# Patient Record
Sex: Male | Born: 1935 | Race: White | Hispanic: No
Health system: Southern US, Community
[De-identification: ages and names within clinical notes are randomized; demographics above are authoritative.]

## PROBLEM LIST (undated history)

## (undated) DIAGNOSIS — I739 Peripheral vascular disease, unspecified: Secondary | ICD-10-CM

## (undated) DIAGNOSIS — F329 Major depressive disorder, single episode, unspecified: Secondary | ICD-10-CM

## (undated) DIAGNOSIS — E785 Hyperlipidemia, unspecified: Secondary | ICD-10-CM

## (undated) DIAGNOSIS — G20 Parkinson's disease: Secondary | ICD-10-CM

## (undated) DIAGNOSIS — Z9181 History of falling: Secondary | ICD-10-CM

## (undated) DIAGNOSIS — F0390 Unspecified dementia without behavioral disturbance: Secondary | ICD-10-CM

## (undated) DIAGNOSIS — K5900 Constipation, unspecified: Secondary | ICD-10-CM

## (undated) DIAGNOSIS — I2510 Atherosclerotic heart disease of native coronary artery without angina pectoris: Secondary | ICD-10-CM

## (undated) DIAGNOSIS — G4700 Insomnia, unspecified: Secondary | ICD-10-CM

## (undated) DIAGNOSIS — N189 Chronic kidney disease, unspecified: Secondary | ICD-10-CM

## (undated) DIAGNOSIS — I10 Essential (primary) hypertension: Secondary | ICD-10-CM

## (undated) DIAGNOSIS — K219 Gastro-esophageal reflux disease without esophagitis: Secondary | ICD-10-CM

## (undated) DIAGNOSIS — G20A1 Parkinson's disease without dyskinesia, without mention of fluctuations: Secondary | ICD-10-CM

---

## 2017-11-08 DIAGNOSIS — Z951 Presence of aortocoronary bypass graft: Secondary | ICD-10-CM | POA: Insufficient documentation

## 2019-05-04 DIAGNOSIS — F0280 Dementia in other diseases classified elsewhere without behavioral disturbance: Secondary | ICD-10-CM | POA: Insufficient documentation

## 2021-12-28 ENCOUNTER — Emergency Department (HOSPITAL_COMMUNITY)
Admission: EM | Admit: 2021-12-28 | Discharge: 2021-12-28 | Disposition: A | Discharge status: Home / Self Care | Payer: No Typology Code available for payment source | Attending: Emergency Medicine | Admitting: Emergency Medicine

## 2021-12-28 ENCOUNTER — Other Ambulatory Visit: Payer: Self-pay

## 2021-12-28 ENCOUNTER — Encounter (HOSPITAL_COMMUNITY): Payer: Self-pay | Admitting: Emergency Medicine

## 2021-12-28 ENCOUNTER — Emergency Department (HOSPITAL_COMMUNITY): Payer: No Typology Code available for payment source

## 2021-12-28 DIAGNOSIS — Y92002 Bathroom of unspecified non-institutional (private) residence single-family (private) house as the place of occurrence of the external cause: Secondary | ICD-10-CM | POA: Insufficient documentation

## 2021-12-28 DIAGNOSIS — Z888 Allergy status to other drugs, medicaments and biological substances status: Secondary | ICD-10-CM | POA: Diagnosis not present

## 2021-12-28 DIAGNOSIS — N179 Acute kidney failure, unspecified: Secondary | ICD-10-CM | POA: Diagnosis not present

## 2021-12-28 DIAGNOSIS — I129 Hypertensive chronic kidney disease with stage 1 through stage 4 chronic kidney disease, or unspecified chronic kidney disease: Secondary | ICD-10-CM | POA: Diagnosis not present

## 2021-12-28 DIAGNOSIS — D696 Thrombocytopenia, unspecified: Secondary | ICD-10-CM | POA: Diagnosis not present

## 2021-12-28 DIAGNOSIS — G20 Parkinson's disease: Secondary | ICD-10-CM | POA: Diagnosis not present

## 2021-12-28 DIAGNOSIS — Z781 Physical restraint status: Secondary | ICD-10-CM | POA: Diagnosis not present

## 2021-12-28 DIAGNOSIS — S01111A Laceration without foreign body of right eyelid and periocular area, initial encounter: Secondary | ICD-10-CM | POA: Diagnosis not present

## 2021-12-28 DIAGNOSIS — Z880 Allergy status to penicillin: Secondary | ICD-10-CM | POA: Diagnosis not present

## 2021-12-28 DIAGNOSIS — F0390 Unspecified dementia without behavioral disturbance: Secondary | ICD-10-CM | POA: Diagnosis not present

## 2021-12-28 DIAGNOSIS — E43 Unspecified severe protein-calorie malnutrition: Secondary | ICD-10-CM | POA: Diagnosis not present

## 2021-12-28 DIAGNOSIS — Z91013 Allergy to seafood: Secondary | ICD-10-CM | POA: Diagnosis not present

## 2021-12-28 DIAGNOSIS — W19XXXA Unspecified fall, initial encounter: Secondary | ICD-10-CM

## 2021-12-28 DIAGNOSIS — S0181XA Laceration without foreign body of other part of head, initial encounter: Secondary | ICD-10-CM | POA: Diagnosis not present

## 2021-12-28 DIAGNOSIS — W1812XA Fall from or off toilet with subsequent striking against object, initial encounter: Secondary | ICD-10-CM | POA: Diagnosis not present

## 2021-12-28 DIAGNOSIS — I2510 Atherosclerotic heart disease of native coronary artery without angina pectoris: Secondary | ICD-10-CM | POA: Diagnosis not present

## 2021-12-28 DIAGNOSIS — Z20822 Contact with and (suspected) exposure to covid-19: Secondary | ICD-10-CM | POA: Diagnosis not present

## 2021-12-28 DIAGNOSIS — I455 Other specified heart block: Secondary | ICD-10-CM | POA: Diagnosis not present

## 2021-12-28 DIAGNOSIS — Z23 Encounter for immunization: Secondary | ICD-10-CM | POA: Diagnosis not present

## 2021-12-28 DIAGNOSIS — G9341 Metabolic encephalopathy: Secondary | ICD-10-CM | POA: Diagnosis not present

## 2021-12-28 DIAGNOSIS — E860 Dehydration: Secondary | ICD-10-CM | POA: Diagnosis present

## 2021-12-28 DIAGNOSIS — F32A Depression, unspecified: Secondary | ICD-10-CM | POA: Diagnosis not present

## 2021-12-28 DIAGNOSIS — S0993XA Unspecified injury of face, initial encounter: Secondary | ICD-10-CM | POA: Diagnosis present

## 2021-12-28 DIAGNOSIS — F0280 Dementia in other diseases classified elsewhere without behavioral disturbance: Secondary | ICD-10-CM | POA: Diagnosis not present

## 2021-12-28 DIAGNOSIS — N1831 Chronic kidney disease, stage 3a: Secondary | ICD-10-CM | POA: Diagnosis not present

## 2021-12-28 DIAGNOSIS — Z91041 Radiographic dye allergy status: Secondary | ICD-10-CM | POA: Diagnosis not present

## 2021-12-28 HISTORY — DX: Parkinson's disease: G20

## 2021-12-28 HISTORY — DX: Parkinson's disease without dyskinesia, without mention of fluctuations: G20.A1

## 2021-12-28 LAB — CBC WITH DIFFERENTIAL/PLATELET
Abs Immature Granulocytes: 0.02 10*3/uL (ref 0.00–0.07)
Basophils Absolute: 0.1 10*3/uL (ref 0.0–0.1)
Basophils Relative: 1 %
Eosinophils Absolute: 0.4 10*3/uL (ref 0.0–0.5)
Eosinophils Relative: 6 %
HCT: 41.9 % (ref 39.0–52.0)
Hemoglobin: 14.4 g/dL (ref 13.0–17.0)
Immature Granulocytes: 0 %
Lymphocytes Relative: 30 %
Lymphs Abs: 1.9 10*3/uL (ref 0.7–4.0)
MCH: 31.8 pg (ref 26.0–34.0)
MCHC: 34.4 g/dL (ref 30.0–36.0)
MCV: 92.5 fL (ref 80.0–100.0)
Monocytes Absolute: 0.7 10*3/uL (ref 0.1–1.0)
Monocytes Relative: 12 %
Neutro Abs: 3.2 10*3/uL (ref 1.7–7.7)
Neutrophils Relative %: 51 %
Platelets: 142 10*3/uL — ABNORMAL LOW (ref 150–400)
RBC: 4.53 MIL/uL (ref 4.22–5.81)
RDW: 13.2 % (ref 11.5–15.5)
WBC: 6.2 10*3/uL (ref 4.0–10.5)
nRBC: 0 % (ref 0.0–0.2)

## 2021-12-28 LAB — COMPREHENSIVE METABOLIC PANEL
ALT: 13 U/L (ref 0–44)
AST: 16 U/L (ref 15–41)
Albumin: 3.9 g/dL (ref 3.5–5.0)
Alkaline Phosphatase: 67 U/L (ref 38–126)
Anion gap: 7 (ref 5–15)
BUN: 23 mg/dL (ref 8–23)
CO2: 24 mmol/L (ref 22–32)
Calcium: 8.8 mg/dL — ABNORMAL LOW (ref 8.9–10.3)
Chloride: 115 mmol/L — ABNORMAL HIGH (ref 98–111)
Creatinine, Ser: 1.4 mg/dL — ABNORMAL HIGH (ref 0.61–1.24)
GFR, Estimated: 50 mL/min — ABNORMAL LOW (ref >60)
Glucose, Bld: 116 mg/dL — ABNORMAL HIGH (ref 70–99)
Potassium: 3.7 mmol/L (ref 3.5–5.1)
Sodium: 146 mmol/L — ABNORMAL HIGH (ref 135–145)
Total Bilirubin: 0.6 mg/dL (ref 0.3–1.2)
Total Protein: 6.6 g/dL (ref 6.5–8.1)

## 2021-12-28 LAB — URINALYSIS, ROUTINE W REFLEX MICROSCOPIC
Bacteria, UA: NONE SEEN
Bilirubin Urine: NEGATIVE
Glucose, UA: NEGATIVE mg/dL
Ketones, ur: NEGATIVE mg/dL
Leukocytes,Ua: NEGATIVE
Nitrite: NEGATIVE
Protein, ur: NEGATIVE mg/dL
Specific Gravity, Urine: 1.006 (ref 1.005–1.030)
pH: 7 (ref 5.0–8.0)

## 2021-12-28 MED ORDER — LIDOCAINE-EPINEPHRINE (PF) 2 %-1:200000 IJ SOLN
10.0000 mL | Freq: Once | INTRAMUSCULAR | Status: DC
  Filled 2021-12-28: qty 20

## 2021-12-28 MED ORDER — TETANUS-DIPHTH-ACELL PERTUSSIS 5-2.5-18.5 LF-MCG/0.5 IM SUSY
0.5000 mL | PREFILLED_SYRINGE | Freq: Once | INTRAMUSCULAR | Status: AC
  Administered 2021-12-28: 0.5 mL via INTRAMUSCULAR
  Filled 2021-12-28: qty 0.5

## 2021-12-28 NOTE — Discharge Instructions (Addendum)
It was a pleasure taking care of you today!  ? ?Your CT head was unremarkable.  Your laceration was repaired with Dermabond in the ED.  Keep the area clean and dry.  Return to the emergency department if worsening or persistent pain, drainage of wound, increased swelling, or color change to area.  ? ?Please use Tylenol or ibuprofen for pain.  You may use 600 mg ibuprofen every 6 hours or 1000 mg of Tylenol every 6 hours.  You may choose to alternate between the 2.  This would be most effective.  Not to exceed 4 g of Tylenol within 24 hours.  Not to exceed 3200 mg ibuprofen 24 hours. ? ?You can use ice for up to 15 minutes at a time to the affected area.  I encourage you to drink plenty of fluids in the next few days to help with possible mild dehydration. ?

## 2021-12-28 NOTE — ED Provider Notes (Signed)
?[facility] DEPT ?Provider Note ? ? ?CSN: [id number] ?Arrival date & time: 12/28/21  1815 ? ?  ? ?History ? ?Chief Complaint  ?Patient presents with  ? Fall  ? Head Laceration  ? ? ?Jose Gilbert is a 88 y.o. male with a past medical history of dementia from [place] who presents to the emergency department with concerns for unwitnessed fall prior to arrival.  Patient's daughter noted that she presented to patient's room at [facility] after the fall and helped the patient up.  She notes the patient was wedged between the toilet and the wall.  Patient daughter notes that he is at baseline at this time.  Patient denies chest pain, shortness of breath, neck pain, back pain, nausea, vomiting.  Patient is not on blood thinners at this time. ? ? ?The history is provided by the patient. No language interpreter was used.  ? ?  ? ?Home Medications ?Prior to Admission medications   ?Not on File  ?   ? ?Allergies    ?Benadryl [diphenhydramine], Imdur [isosorbide nitrate], Iodinated contrast media, Mirtazapine, Penicillins, Shellfish allergy, and Tramadol   ? ?Review of Systems   ?Review of Systems  ?Constitutional:  Negative for chills and fever.  ?Respiratory:  Negative for shortness of breath.   ?Cardiovascular:  Negative for chest pain.  ?Gastrointestinal:  Negative for nausea and vomiting.  ?Musculoskeletal:  Negative for neck pain.  ?Skin:  Positive for wound.  ?All other systems reviewed and are negative. ? ?Physical Exam ?Updated Vital Signs ?BP (!) 188/78   Pulse 72   Temp 98.1 ?F (36.7 ?C) (Oral)   Resp 19   SpO2 99%  ?Physical Exam ?Vitals and nursing note reviewed.  ?Constitutional:   ?   General: He is not in acute distress. ?   Appearance: He is not diaphoretic.  ?HENT:  ?   Head: Normocephalic. Laceration present.  ?   Comments: 2 cm laceration noted to right upper eyelid.  1 cm laceration noted inferior to right eye.  No tenderness to palpation noted to the area.  Bleeding  controlled. ?   Mouth/Throat:  ?   Pharynx: No oropharyngeal exudate.  ?Eyes:  ?   General: No scleral icterus. ?   Conjunctiva/sclera: Conjunctivae normal.  ?Cardiovascular:  ?   Rate and Rhythm: Normal rate and regular rhythm.  ?   Pulses: Normal pulses.  ?   Heart sounds: Normal heart sounds.  ?Pulmonary:  ?   Effort: Pulmonary effort is normal. No respiratory distress.  ?   Breath sounds: Normal breath sounds. No wheezing.  ?Abdominal:  ?   General: Bowel sounds are normal.  ?   Palpations: Abdomen is soft. There is no mass.  ?   Tenderness: There is no abdominal tenderness. There is no guarding or rebound.  ?Musculoskeletal:     ?   General: Normal range of motion.  ?   Cervical back: Normal range of motion and neck supple.  ?Skin: ?   General: Skin is warm and dry.  ?Neurological:  ?   Mental Status: He is alert.  ?Psychiatric:     ?   Behavior: Behavior normal.  ? ? ?ED Results / Procedures / Treatments   ?Labs ?(all labs ordered are listed, but only abnormal results are displayed) ?Labs Reviewed - No data to display ? ?EKG ?None ? ?Radiology ?CT Head Wo Contrast ? ?Result Date: 12/28/2021 ?CLINICAL DATA:  Trauma, fall EXAM: CT HEAD WITHOUT CONTRAST TECHNIQUE: Contiguous axial images  were obtained from the base of the skull through the vertex without intravenous contrast. RADIATION DOSE REDUCTION: This exam was performed according to the departmental dose-optimization program which includes automated exposure control, adjustment of the mA and/or kV according to patient size and/or use of iterative reconstruction technique. COMPARISON:  None Available. FINDINGS: Brain: No acute intracranial findings are seen in noncontrast CT brain. There are no signs of bleeding. Cortical sulci are prominent. There is decreased density in the periventricular white matter. Small calcifications are seen in the basal ganglia. Vascular: Scattered arterial calcifications are seen. Skull: No fracture is seen in the calvarium.  Sinuses/Orbits: Paranasal sinuses are unremarkable. Postsurgical changes are noted in both optic globes. Other: None IMPRESSION: No acute intracranial findings are seen in noncontrast CT brain. Atrophy. Small-vessel disease. Electronically Signed   By: Ernie Avena M.D.   On: 12/28/2021 18:53   ? ?Procedures ?Marland Kitchen.Laceration Repair ? ?Date/Time: 12/28/2021 9:55 PM ?Performed by: Cyndee Brightly, Jasyah Theurer A, PA-C ?Authorized by: Chestine Spore A, PA-C  ? ?Consent:  ?  Consent obtained:  Verbal ?  Consent given by:  Patient ?  Risks discussed:  Infection, pain and need for additional repair ?Universal protocol:  ?  Test results available: yes   ?  Patient identity confirmed:  Verbally with patient and hospital-assigned identification number ?Anesthesia:  ?  Anesthesia method:  None ?Laceration details:  ?  Location:  Face ?  Face location:  R upper eyelid ?  Extent:  Superficial ?Pre-procedure details:  ?  Preparation:  Patient was prepped and draped in usual sterile fashion ?Exploration:  ?  Hemostasis achieved with:  Direct pressure ?  Imaging outcome: foreign body not noted   ?  Wound exploration: entire depth of wound visualized   ?Treatment:  ?  Area cleansed with:  Saline ?  Amount of cleaning:  Standard ?  Irrigation solution:  Sterile saline ?  Irrigation method:  Syringe ?  Visualized foreign bodies/material removed: no   ?Skin repair:  ?  Repair method:  Tissue adhesive ?Approximation:  ?  Approximation:  Close ?Repair type:  ?  Repair type:  Simple ?Post-procedure details:  ?  Dressing:  Non-adherent dressing ?  Procedure completion:  Tolerated well, no immediate complications  ? ? ?Medications Ordered in ED ?Medications - No data to display ? ?ED Course/ Medical Decision Making/ A&P ?Clinical Course as of 12/28/21 2203  ?Mon Dec 28, 2021  ?2202 If labs okay, ambulate to freedom. Unwitnessed fall, dementia, no blood thinners. Lac already repaired. [CP]  ?  ?Clinical Course User Index ?[CP] Prosperi, Christian H, PA-C   ? ? ?                        ?Medical Decision Making ?Amount and/or Complexity of Data Reviewed ?Labs: ordered. ?Radiology: ordered. ? ?Risk ?Prescription drug management. ? ? ?Patient presents with fall and laceration noted onset prior to arrival.  This was unwitnessed fall.  Patient is from [place].  Patient accompanied by daughter at bedside.  Patient is not on anticoagulants at this time.  Vital signs stable, patient afebrile.  Tetanus updated in the ED.  Laceration occurred < 12 hours prior to repair.  Differential diagnosis includes, fracture, foreign body, dislocation, avulsion, anemia, arrhythmia, intracranial abnormality.  ? ?Additional history obtained:  ?Additional history obtained from Daughter/Son ? ?Labs:  ?I ordered, and personally interpreted labs.  The pertinent results include:   ?CBC, BMP, urinalysis ordered with results pending at time  of sign-out. ? ?Imaging: ?I ordered imaging studies including CT head  ?I independently visualized and interpreted imaging which showed:  ?No acute intracranial findings are seen in noncontrast CT brain.  ?Atrophy. Small-vessel disease.  ? ?I agree with the radiologist interpretation ? ? ?Patient case discussed with Quentin Mulling, PA-C at sign-out. Plan at sign-out is pending labs and ambulation. Likely discharge home if no abnormalities. Patient care transferred at sign out.  ? ? ? ?This chart was dictated using voice recognition software, Dragon. Despite the best efforts of this provider to proofread and correct errors, errors may still occur which can change documentation meaning. ? ? ?Final Clinical Impression(s) / ED Diagnoses ?Final diagnoses:  ?Fall, initial encounter  ?Facial laceration, initial encounter  ? ? ?Rx / DC Orders ?ED Discharge Orders   ? ? None  ? ?  ? ? ?  ?Evelene Roussin A, PA-C ?12/28/21 2207 ? ?  ?Drenda Freeze, MD ?12/28/21 2337 ? ?

## 2021-12-28 NOTE — ED Provider Notes (Signed)
Accepted handoff at shift change from [facility]. Please see prior provider note for more detail.  ? ?Briefly: Patient is 88 y.o.  ? ?DDX: concern for unwitnessed fall at assisted living facility in [place].  Daughter found him shortly thereafter.  He had a laceration of the right eyebrow which was repaired prior to meeting over the patient.  He has history of Parkinson's, dementia.  Patient does not complain of any pain. ? ?Plan: I personally interpreted lab work including CBC with differential which is unremarkable, CMP with mild elevation of sodium, creatinine 1.4, very mild hypernatremia, mildly elevated creatinine with no baseline on file may suggest minimal dehydration, will encourage oral hydration at his facility.  His urine is unremarkable.  CT head without contrast unremarkable prior to my evaluation. ? ?Patient is ambulatory for discharge.  He is discharged stable condition at this time, encouraged Tylenol, ice for pain control, encourage oral hydration at facility. ? ? ? ? ? ?RISR ? ?EDTHIS ? ?  ?Olene Floss, PA-C ?12/28/21 2302 ? ?  ?Charlynne Pander, MD ?12/28/21 2339 ? ?

## 2021-12-28 NOTE — ED Triage Notes (Signed)
Pt had an unwitnessed fall at [facility]. Pt was found between the toilet and the wall. Pt has lacerations to his right eyebrow. Pt has hx of Parkinson's. Pt has hx of intermittent unresponsiveness d/t his Parkinson's at baseline. ?

## 2021-12-30 ENCOUNTER — Other Ambulatory Visit: Payer: Self-pay

## 2021-12-30 ENCOUNTER — Inpatient Hospital Stay (HOSPITAL_COMMUNITY)
Admission: EM | Admit: 2021-12-30 | Discharge: 2022-01-04 | DRG: 682 | Disposition: A | Discharge status: Skilled Nursing Facility | Payer: No Typology Code available for payment source | Source: Skilled Nursing Facility | Attending: Internal Medicine | Admitting: Internal Medicine

## 2021-12-30 ENCOUNTER — Emergency Department (HOSPITAL_COMMUNITY): Payer: No Typology Code available for payment source

## 2021-12-30 DIAGNOSIS — E43 Unspecified severe protein-calorie malnutrition: Secondary | ICD-10-CM | POA: Insufficient documentation

## 2021-12-30 DIAGNOSIS — W19XXXA Unspecified fall, initial encounter: Secondary | ICD-10-CM | POA: Diagnosis present

## 2021-12-30 DIAGNOSIS — I129 Hypertensive chronic kidney disease with stage 1 through stage 4 chronic kidney disease, or unspecified chronic kidney disease: Secondary | ICD-10-CM | POA: Diagnosis present

## 2021-12-30 DIAGNOSIS — R5383 Other fatigue: Secondary | ICD-10-CM

## 2021-12-30 DIAGNOSIS — Z781 Physical restraint status: Secondary | ICD-10-CM

## 2021-12-30 DIAGNOSIS — E860 Dehydration: Secondary | ICD-10-CM | POA: Diagnosis not present

## 2021-12-30 DIAGNOSIS — Z91041 Radiographic dye allergy status: Secondary | ICD-10-CM

## 2021-12-30 DIAGNOSIS — F32A Depression, unspecified: Secondary | ICD-10-CM | POA: Diagnosis present

## 2021-12-30 DIAGNOSIS — S01111A Laceration without foreign body of right eyelid and periocular area, initial encounter: Secondary | ICD-10-CM | POA: Diagnosis present

## 2021-12-30 DIAGNOSIS — G20 Parkinson's disease: Secondary | ICD-10-CM | POA: Diagnosis present

## 2021-12-30 DIAGNOSIS — I455 Other specified heart block: Secondary | ICD-10-CM | POA: Diagnosis present

## 2021-12-30 DIAGNOSIS — D696 Thrombocytopenia, unspecified: Secondary | ICD-10-CM | POA: Diagnosis present

## 2021-12-30 DIAGNOSIS — N179 Acute kidney failure, unspecified: Principal | ICD-10-CM | POA: Diagnosis present

## 2021-12-30 DIAGNOSIS — G9341 Metabolic encephalopathy: Secondary | ICD-10-CM | POA: Diagnosis present

## 2021-12-30 DIAGNOSIS — G9340 Encephalopathy, unspecified: Secondary | ICD-10-CM | POA: Diagnosis present

## 2021-12-30 DIAGNOSIS — Z23 Encounter for immunization: Secondary | ICD-10-CM

## 2021-12-30 DIAGNOSIS — I2510 Atherosclerotic heart disease of native coronary artery without angina pectoris: Secondary | ICD-10-CM | POA: Diagnosis present

## 2021-12-30 DIAGNOSIS — Z91013 Allergy to seafood: Secondary | ICD-10-CM

## 2021-12-30 DIAGNOSIS — S0990XA Unspecified injury of head, initial encounter: Secondary | ICD-10-CM

## 2021-12-30 DIAGNOSIS — N1831 Chronic kidney disease, stage 3a: Secondary | ICD-10-CM | POA: Diagnosis present

## 2021-12-30 DIAGNOSIS — F0280 Dementia in other diseases classified elsewhere without behavioral disturbance: Secondary | ICD-10-CM | POA: Diagnosis present

## 2021-12-30 DIAGNOSIS — Z20822 Contact with and (suspected) exposure to covid-19: Secondary | ICD-10-CM | POA: Diagnosis present

## 2021-12-30 DIAGNOSIS — Z880 Allergy status to penicillin: Secondary | ICD-10-CM

## 2021-12-30 DIAGNOSIS — Z888 Allergy status to other drugs, medicaments and biological substances status: Secondary | ICD-10-CM

## 2021-12-30 HISTORY — DX: Essential (primary) hypertension: I10

## 2021-12-30 LAB — URINALYSIS, ROUTINE W REFLEX MICROSCOPIC
Bilirubin Urine: NEGATIVE
Glucose, UA: NEGATIVE mg/dL
Hgb urine dipstick: NEGATIVE
Ketones, ur: NEGATIVE mg/dL
Leukocytes,Ua: NEGATIVE
Nitrite: NEGATIVE
Protein, ur: NEGATIVE mg/dL
Specific Gravity, Urine: 1.009 (ref 1.005–1.030)
pH: 6 (ref 5.0–8.0)

## 2021-12-30 LAB — COMPREHENSIVE METABOLIC PANEL
ALT: 14 U/L (ref 0–44)
AST: 15 U/L (ref 15–41)
Albumin: 4.3 g/dL (ref 3.5–5.0)
Alkaline Phosphatase: 80 U/L (ref 38–126)
Anion gap: 5 (ref 5–15)
BUN: 28 mg/dL — ABNORMAL HIGH (ref 8–23)
CO2: 26 mmol/L (ref 22–32)
Calcium: 9.1 mg/dL (ref 8.9–10.3)
Chloride: 112 mmol/L — ABNORMAL HIGH (ref 98–111)
Creatinine, Ser: 1.95 mg/dL — ABNORMAL HIGH (ref 0.61–1.24)
GFR, Estimated: 34 mL/min — ABNORMAL LOW (ref >60)
Glucose, Bld: 111 mg/dL — ABNORMAL HIGH (ref 70–99)
Potassium: 4.6 mmol/L (ref 3.5–5.1)
Sodium: 143 mmol/L (ref 135–145)
Total Bilirubin: 0.5 mg/dL (ref 0.3–1.2)
Total Protein: 7.2 g/dL (ref 6.5–8.1)

## 2021-12-30 LAB — CBC WITH DIFFERENTIAL/PLATELET
Abs Immature Granulocytes: 0.01 10*3/uL (ref 0.00–0.07)
Basophils Absolute: 0 10*3/uL (ref 0.0–0.1)
Basophils Relative: 1 %
Eosinophils Absolute: 0.4 10*3/uL (ref 0.0–0.5)
Eosinophils Relative: 7 %
HCT: 42.6 % (ref 39.0–52.0)
Hemoglobin: 14.3 g/dL (ref 13.0–17.0)
Immature Granulocytes: 0 %
Lymphocytes Relative: 45 %
Lymphs Abs: 3 10*3/uL (ref 0.7–4.0)
MCH: 30.8 pg (ref 26.0–34.0)
MCHC: 33.6 g/dL (ref 30.0–36.0)
MCV: 91.8 fL (ref 80.0–100.0)
Monocytes Absolute: 0.8 10*3/uL (ref 0.1–1.0)
Monocytes Relative: 12 %
Neutro Abs: 2.3 10*3/uL (ref 1.7–7.7)
Neutrophils Relative %: 35 %
Platelets: 142 10*3/uL — ABNORMAL LOW (ref 150–400)
RBC: 4.64 MIL/uL (ref 4.22–5.81)
RDW: 13.3 % (ref 11.5–15.5)
WBC: 6.6 10*3/uL (ref 4.0–10.5)
nRBC: 0 % (ref 0.0–0.2)

## 2021-12-30 LAB — RESP PANEL BY RT-PCR (FLU A&B, COVID) ARPGX2
Influenza A by PCR: NEGATIVE
Influenza B by PCR: NEGATIVE
SARS Coronavirus 2 by RT PCR: NEGATIVE

## 2021-12-30 LAB — TROPONIN I (HIGH SENSITIVITY): Troponin I (High Sensitivity): 6 ng/L (ref <18)

## 2021-12-30 LAB — TSH: TSH: 0.982 u[IU]/mL (ref 0.350–4.500)

## 2021-12-30 MED ORDER — LACTATED RINGERS IV BOLUS
1000.0000 mL | Freq: Once | INTRAVENOUS | Status: AC
  Administered 2021-12-31: 1000 mL via INTRAVENOUS

## 2021-12-30 NOTE — ED Provider Notes (Signed)
[facility]-EMERGENCY DEPT Provider Note   CSN: [id number] Arrival date & time: 12/30/21  2000     History  No chief complaint on file.   Jose Gilbert is a 88 y.o. male.   Patient as above with significant medical history as below, including Parkinson's disease, lives at nursing facility who presents to the ED with complaint of fatigue.  AMS.  Patient recently seen in the ER for a fall.  CT at the time was negative.  Facility reports patient was more somnolent this afternoon than normal.  Family saw him this afternoon at lunchtime and said he was more sleepy than normal, patient request to go to bed early.   Daughter felt this was abnormal and patient was sent to the ER for evaluation.  Patient has been refusing meals, not drinking much liquid past 24 to 48 hours.  On my assessment patient has no acute complaints.  He has no headaches, chest pain, no nausea or vomiting.  No vision changes, no numbness or tingling.  Reports he is eating and drinking normally.  He has no discomfort.  When asked patient why he was not drinking liquids he responded by saying that "nobody gives me anything to drink."    Past Medical History: No date: Parkinson's disease (HCC)  No past surgical history on file.    The history is provided by the patient and a relative. No language interpreter was used.      Home Medications Prior to Admission medications   Not on File      Allergies    Benadryl [diphenhydramine], Imdur [isosorbide nitrate], Iodinated contrast media, Mirtazapine, Penicillins, Shellfish allergy, and Tramadol    Review of Systems   Review of Systems  Constitutional:  Positive for fatigue. Negative for chills and fever.  HENT:  Negative for facial swelling and trouble swallowing.   Eyes:  Negative for photophobia and visual disturbance.  Respiratory:  Negative for cough and shortness of breath.   Cardiovascular:  Negative for chest pain and palpitations.   Gastrointestinal:  Negative for abdominal pain, nausea and vomiting.  Endocrine: Negative for polydipsia and polyuria.  Genitourinary:  Negative for difficulty urinating and hematuria.  Musculoskeletal:  Negative for gait problem and joint swelling.  Skin:  Negative for pallor and rash.  Neurological:  Negative for syncope and headaches.  Psychiatric/Behavioral:  Negative for agitation and confusion.    Physical Exam Updated Vital Signs BP (!) 144/111   Pulse 70   Temp 98.8 F (37.1 C) (Rectal)   Resp 20   SpO2 100%  Physical Exam Vitals and nursing note reviewed.  Constitutional:      General: He is not in acute distress.    Appearance: Normal appearance. He is well-developed.  HENT:     Head: Normocephalic.     Comments: Tissue adhesive noted to right temporal, periorbital area.    Right Ear: External ear normal.     Left Ear: External ear normal.     Mouth/Throat:     Mouth: Mucous membranes are dry.  Eyes:     General: No scleral icterus.    Extraocular Movements: Extraocular movements intact.     Pupils: Pupils are equal, round, and reactive to light.  Cardiovascular:     Rate and Rhythm: Normal rate and regular rhythm.     Pulses: Normal pulses.     Heart sounds: Normal heart sounds.  Pulmonary:     Effort: Pulmonary effort is normal. No respiratory distress.  Breath sounds: Normal breath sounds.  Abdominal:     General: Abdomen is flat.     Palpations: Abdomen is soft.     Tenderness: There is no abdominal tenderness.  Musculoskeletal:        General: Normal range of motion.     Cervical back: Normal range of motion.     Right lower leg: No edema.     Left lower leg: No edema.  Skin:    General: Skin is warm and dry.     Capillary Refill: Capillary refill takes less than 2 seconds.  Neurological:     Mental Status: He is alert and oriented to person, place, and time.     GCS: GCS eye subscore is 4. GCS verbal subscore is 5. GCS motor subscore is 6.      Cranial Nerves: Cranial nerves 2-12 are intact.     Sensory: Sensation is intact.     Motor: Motor function is intact.     Coordination: Coordination is intact.  Psychiatric:        Mood and Affect: Mood normal.        Behavior: Behavior normal.    ED Results / Procedures / Treatments   Labs (all labs ordered are listed, but only abnormal results are displayed) Labs Reviewed  CBC WITH DIFFERENTIAL/PLATELET - Abnormal; Notable for the following components:      Result Value   Platelets 142 (*)    All other components within normal limits  COMPREHENSIVE METABOLIC PANEL - Abnormal; Notable for the following components:   Chloride 112 (*)    Glucose, Bld 111 (*)    BUN 28 (*)    Creatinine, Ser 1.95 (*)    GFR, Estimated 34 (*)    All other components within normal limits  URINALYSIS, ROUTINE W REFLEX MICROSCOPIC - Abnormal; Notable for the following components:   Color, Urine STRAW (*)    All other components within normal limits  RESP PANEL BY RT-PCR (FLU A&B, COVID) ARPGX2  TSH  TROPONIN I (HIGH SENSITIVITY)  TROPONIN I (HIGH SENSITIVITY)    EKG EKG Interpretation  Date/Time:  Wednesday Dec 30 2021 20:23:03 EDT Ventricular Rate:  71 PR Interval:  331 QRS Duration: 108 QT Interval:  413 QTC Calculation: 449 R Axis:   -44 Text Interpretation: Sinus rhythm Prolonged PR interval Abnormal R-wave progression, early transition Inferior infarct, old Similar to prior tracing Confirmed by Tanda Rockers ([id number]) on 12/31/2021 12:02:12 AM  Radiology CT Head Wo Contrast  Result Date: 12/30/2021 CLINICAL DATA:  Fall injury.  Delirium.  Altered mental status. EXAM: CT HEAD WITHOUT CONTRAST TECHNIQUE: Contiguous axial images were obtained from the base of the skull through the vertex without intravenous contrast. RADIATION DOSE REDUCTION: This exam was performed according to the departmental dose-optimization program which includes automated exposure control, adjustment of the mA and/or kV  according to patient size and/or use of iterative reconstruction technique. COMPARISON:  Head CT 12/28/2021. FINDINGS: Brain: There is moderate cerebral atrophy, small vessel disease and atrophic ventriculomegaly, without midline shift and with senescent calcification in the basal ganglia. Cerebellum and brainstem are unremarkable, as visualized. No asymmetry is seen worrisome for acute infarct, hemorrhage or mass. 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 There is no mass effect. Vascular: There calcifications of the carotid siphons and distal vertebral arteries but no hyperdense central vasculature. Skull: Intact calvarium, skull base and orbits. No focal bone lesion. No visible scalp hematoma. Sinuses/Orbits: No acute abnormality.  No appreciable mastoid effusion. Old lens replacements. Other: None. IMPRESSION: Chronic changes. No acute intracranial CT findings, depressed skull fractures or interval changes. Electronically Signed   By: Almira Bar M.D.   On: 12/30/2021 21:15   DG Chest Portable 1 View  Result Date: 12/30/2021 CLINICAL DATA:  Altered mental status EXAM: PORTABLE CHEST 1 VIEW COMPARISON:  None Available. FINDINGS: Lungs are clear.  No pleural effusion or pneumothorax. The heart is normal in size. Median sternotomy. IMPRESSION: No evidence of acute cardiopulmonary disease. Electronically Signed   By: Charline Bills M.D.   On: 12/30/2021 21:18    Procedures Procedures    Medications Ordered in ED Medications  lactated ringers bolus 1,000 mL (1,000 mLs Intravenous New Bag/Given 12/31/21 0049)    ED Course/ Medical Decision Making/ A&P Clinical Course as of 12/31/21 0056  Wed Dec 30, 2021  2345 Sandre Kitty Daughter     [phone]  [SG]    Clinical Course User Index [SG] Sloan Leiter, DO                           Medical Decision Making Amount and/or Complexity of Data Reviewed Labs: ordered. Radiology:  ordered.  Risk Decision regarding hospitalization.    CC: Fatigue, sleepy  This patient presents to the Emergency Department for the above complaint. This involves an extensive number of treatment options and is a complaint that carries with it a high risk of complications and morbidity. Vital signs were reviewed. Serious etiologies considered.  DDx includes not limited to concussion, closed head injury, metabolic, infectious, dehydration, progression of Parkinson disease, other acute abnormalities  Record review:  Previous records obtained and reviewed  Recent ED notes, recent labs and imaging.  Additional history obtained from daughter [facility] and surgical history as noted above.   Work up as above, notable for:  Labs & imaging results that were available during my care of the patient were visualized by me and considered in my medical decision making.   I ordered imaging studies which included chest x-ray, CT head and I visualized the imaging and I agree with radiologist interpretation.  No acute process  Cardiac monitoring reviewed and interpreted personally which shows NSR  Labs reviewed, creatinine 3 days ago is 1.4, creatinine today is 1.95.  GFR is worsened.  Electrolytes stable.  BUN is elevated.  Patient does report reduced p.o. intake past 2 days since his fall.  Patient to be started on IV fluids.  Given worsening renal function of the past 48 hours, reduced p.o. intake.  Would recommend admission for overnight rehydration.  Repeat creatinine tomorrow.  Patient is agreeable.  Family agreeable.  Management: IV fluids  Mental status change likely secondary to dehydration in conjunction with possible concussion. Neuro exam is non-focal, low suspicion for CVA.   Recommend admission, patient and family agreeable.  Discussed with Dr. Antionette Char who accepts pt. Pt is HDS.                  Social determinants of health include -  Social History    Socioeconomic History   Marital status: Widowed    Spouse name: Not on file   Number of children: Not on file   Years of education: Not on file   Highest education level: Not on file  Occupational History   Not on file  Tobacco Use   Smoking status: Unknown   Smokeless tobacco: Not on file  Substance and  Sexual Activity   Alcohol use: Not Currently   Drug use: Not Currently   Sexual activity: Not Currently  Other Topics Concern   Not on file  Social History Narrative   Not on file   Social Determinants of Health   Financial Resource Strain: Not on file  Food Insecurity: Not on file  Transportation Needs: Not on file  Physical Activity: Not on file  Stress: Not on file  Social Connections: Not on file  Intimate Partner Violence: Not on file      This chart was dictated using voice recognition software.  Despite best efforts to proofread,  errors can occur which can change the documentation meaning.         Final Clinical Impression(s) / ED Diagnoses Final diagnoses:  Injury of head, initial encounter  Other fatigue  AKI (acute kidney injury) (HCC)  Dehydration    Rx / DC Orders ED Discharge Orders     None         Sloan Leiter, DO 12/31/21 5409

## 2021-12-30 NOTE — ED Notes (Signed)
Call received from pt daughter Rickey Barbara [phone] requesting rtn call for pt status/updates. ENMiles ?

## 2021-12-30 NOTE — ED Triage Notes (Signed)
Patient arrived via EMS - per EMS patient was sent from facility due to [place]. EMS states facility could not give how the patients cognitive status is at baseline and just stated that "he is not usually like this". Patient had a fall two days ago and has abrasion/contusion around the right orbital area (was seen at [facility] for this fall). Patient is alert, self and place but unable to state situation or time and does not recall the president or current year. Patient states he was having pain in his lower legs and was moving legs back and forth  ?

## 2021-12-30 NOTE — ED Notes (Signed)
X1 Set of blood cultures obtained if needed. ?

## 2021-12-30 NOTE — ED Notes (Signed)
Received handoff report from Joy D RN prior to transfer of care ?

## 2021-12-30 NOTE — ED Notes (Signed)
Pt transported to XR.  

## 2021-12-31 ENCOUNTER — Encounter (HOSPITAL_COMMUNITY): Payer: Self-pay | Admitting: Family Medicine

## 2021-12-31 DIAGNOSIS — I455 Other specified heart block: Secondary | ICD-10-CM | POA: Diagnosis present

## 2021-12-31 DIAGNOSIS — F32A Depression, unspecified: Secondary | ICD-10-CM | POA: Diagnosis present

## 2021-12-31 DIAGNOSIS — Z20822 Contact with and (suspected) exposure to covid-19: Secondary | ICD-10-CM | POA: Diagnosis present

## 2021-12-31 DIAGNOSIS — I10 Essential (primary) hypertension: Secondary | ICD-10-CM | POA: Insufficient documentation

## 2021-12-31 DIAGNOSIS — G9340 Encephalopathy, unspecified: Secondary | ICD-10-CM | POA: Diagnosis present

## 2021-12-31 DIAGNOSIS — Z888 Allergy status to other drugs, medicaments and biological substances status: Secondary | ICD-10-CM | POA: Diagnosis not present

## 2021-12-31 DIAGNOSIS — G20 Parkinson's disease: Secondary | ICD-10-CM

## 2021-12-31 DIAGNOSIS — N1831 Chronic kidney disease, stage 3a: Secondary | ICD-10-CM | POA: Diagnosis present

## 2021-12-31 DIAGNOSIS — S01111A Laceration without foreign body of right eyelid and periocular area, initial encounter: Secondary | ICD-10-CM | POA: Diagnosis present

## 2021-12-31 DIAGNOSIS — Z91013 Allergy to seafood: Secondary | ICD-10-CM | POA: Diagnosis not present

## 2021-12-31 DIAGNOSIS — I2510 Atherosclerotic heart disease of native coronary artery without angina pectoris: Secondary | ICD-10-CM

## 2021-12-31 DIAGNOSIS — E43 Unspecified severe protein-calorie malnutrition: Secondary | ICD-10-CM | POA: Diagnosis present

## 2021-12-31 DIAGNOSIS — Z781 Physical restraint status: Secondary | ICD-10-CM | POA: Diagnosis not present

## 2021-12-31 DIAGNOSIS — I129 Hypertensive chronic kidney disease with stage 1 through stage 4 chronic kidney disease, or unspecified chronic kidney disease: Secondary | ICD-10-CM | POA: Diagnosis present

## 2021-12-31 DIAGNOSIS — Z91041 Radiographic dye allergy status: Secondary | ICD-10-CM | POA: Diagnosis not present

## 2021-12-31 DIAGNOSIS — D696 Thrombocytopenia, unspecified: Secondary | ICD-10-CM | POA: Diagnosis present

## 2021-12-31 DIAGNOSIS — N179 Acute kidney failure, unspecified: Principal | ICD-10-CM

## 2021-12-31 DIAGNOSIS — E860 Dehydration: Secondary | ICD-10-CM | POA: Diagnosis present

## 2021-12-31 DIAGNOSIS — Z880 Allergy status to penicillin: Secondary | ICD-10-CM | POA: Diagnosis not present

## 2021-12-31 DIAGNOSIS — F0280 Dementia in other diseases classified elsewhere without behavioral disturbance: Secondary | ICD-10-CM | POA: Diagnosis present

## 2021-12-31 DIAGNOSIS — W19XXXA Unspecified fall, initial encounter: Secondary | ICD-10-CM | POA: Diagnosis present

## 2021-12-31 DIAGNOSIS — Z23 Encounter for immunization: Secondary | ICD-10-CM | POA: Diagnosis not present

## 2021-12-31 DIAGNOSIS — G9341 Metabolic encephalopathy: Secondary | ICD-10-CM | POA: Diagnosis present

## 2021-12-31 HISTORY — DX: Atherosclerotic heart disease of native coronary artery without angina pectoris: I25.10

## 2021-12-31 LAB — BASIC METABOLIC PANEL
Anion gap: 9 (ref 5–15)
BUN: 25 mg/dL — ABNORMAL HIGH (ref 8–23)
CO2: 25 mmol/L (ref 22–32)
Calcium: 8.9 mg/dL (ref 8.9–10.3)
Chloride: 110 mmol/L (ref 98–111)
Creatinine, Ser: 1.62 mg/dL — ABNORMAL HIGH (ref 0.61–1.24)
GFR, Estimated: 42 mL/min — ABNORMAL LOW (ref >60)
Glucose, Bld: 107 mg/dL — ABNORMAL HIGH (ref 70–99)
Potassium: 3.8 mmol/L (ref 3.5–5.1)
Sodium: 144 mmol/L (ref 135–145)

## 2021-12-31 LAB — CBC
HCT: 42.5 % (ref 39.0–52.0)
Hemoglobin: 14.5 g/dL (ref 13.0–17.0)
MCH: 30.9 pg (ref 26.0–34.0)
MCHC: 34.1 g/dL (ref 30.0–36.0)
MCV: 90.4 fL (ref 80.0–100.0)
Platelets: 143 10*3/uL — ABNORMAL LOW (ref 150–400)
RBC: 4.7 MIL/uL (ref 4.22–5.81)
RDW: 13.2 % (ref 11.5–15.5)
WBC: 7 10*3/uL (ref 4.0–10.5)
nRBC: 0 % (ref 0.0–0.2)

## 2021-12-31 LAB — RPR: RPR Ser Ql: NONREACTIVE

## 2021-12-31 LAB — AMMONIA: Ammonia: 25 umol/L (ref 9–35)

## 2021-12-31 LAB — SODIUM, URINE, RANDOM: Sodium, Ur: 36 mmol/L

## 2021-12-31 LAB — TROPONIN I (HIGH SENSITIVITY): Troponin I (High Sensitivity): 7 ng/L (ref <18)

## 2021-12-31 LAB — CREATININE, URINE, RANDOM: Creatinine, Urine: 77.55 mg/dL

## 2021-12-31 LAB — VITAMIN B12: Vitamin B-12: 751 pg/mL (ref 180–914)

## 2021-12-31 MED ORDER — QUETIAPINE FUMARATE 25 MG PO TABS
25.0000 mg | ORAL_TABLET | Freq: Every evening | ORAL | Status: DC | PRN
  Administered 2022-01-01 – 2022-01-03 (×3): 25 mg via ORAL
  Filled 2021-12-31 (×3): qty 1

## 2021-12-31 MED ORDER — ACETAMINOPHEN 325 MG PO TABS: 650.0000 mg | ORAL_TABLET | Freq: Four times a day (QID) | ORAL | Status: DC | PRN

## 2021-12-31 MED ORDER — ACETAMINOPHEN 650 MG RE SUPP: 650.0000 mg | Freq: Four times a day (QID) | RECTAL | Status: DC | PRN

## 2021-12-31 MED ORDER — LABETALOL HCL 5 MG/ML IV SOLN
10.0000 mg | INTRAVENOUS | Status: DC | PRN
  Administered 2021-12-31: 10 mg via INTRAVENOUS
  Filled 2021-12-31 (×2): qty 4

## 2021-12-31 MED ORDER — ADULT MULTIVITAMIN W/MINERALS CH
1.0000 | ORAL_TABLET | Freq: Every day | ORAL | Status: DC
  Administered 2021-12-31 – 2022-01-04 (×5): 1 via ORAL
  Filled 2021-12-31 (×4): qty 1

## 2021-12-31 MED ORDER — HEPARIN SODIUM (PORCINE) 5000 UNIT/ML IJ SOLN
5000.0000 [IU] | Freq: Three times a day (TID) | INTRAMUSCULAR | Status: DC
  Administered 2021-12-31 – 2022-01-04 (×14): 5000 [IU] via SUBCUTANEOUS
  Filled 2021-12-31 (×14): qty 1

## 2021-12-31 MED ORDER — PANTOPRAZOLE SODIUM 40 MG PO TBEC
40.0000 mg | DELAYED_RELEASE_TABLET | Freq: Every day | ORAL | Status: DC
  Administered 2021-12-31 – 2022-01-04 (×5): 40 mg via ORAL
  Filled 2021-12-31 (×5): qty 1

## 2021-12-31 MED ORDER — ONDANSETRON HCL 4 MG/2ML IJ SOLN: 4.0000 mg | Freq: Four times a day (QID) | INTRAMUSCULAR | Status: DC | PRN

## 2021-12-31 MED ORDER — SODIUM CHLORIDE 0.9 % IV SOLN: INTRAVENOUS | Status: AC

## 2021-12-31 MED ORDER — ASPIRIN 81 MG PO TBEC
81.0000 mg | DELAYED_RELEASE_TABLET | Freq: Every day | ORAL | Status: DC
  Administered 2021-12-31 – 2022-01-04 (×5): 81 mg via ORAL
  Filled 2021-12-31 (×5): qty 1

## 2021-12-31 MED ORDER — ENSURE ENLIVE PO LIQD
237.0000 mL | Freq: Two times a day (BID) | ORAL | Status: DC
  Administered 2021-12-31 – 2022-01-04 (×9): 237 mL via ORAL

## 2021-12-31 MED ORDER — GALANTAMINE HYDROBROMIDE ER 8 MG PO CP24
8.0000 mg | ORAL_CAPSULE | ORAL | Status: DC
  Administered 2021-12-31 – 2022-01-04 (×3): 8 mg via ORAL
  Filled 2021-12-31 (×3): qty 1

## 2021-12-31 MED ORDER — ONDANSETRON HCL 4 MG PO TABS: 4.0000 mg | ORAL_TABLET | Freq: Four times a day (QID) | ORAL | Status: DC | PRN

## 2021-12-31 MED ORDER — SIMVASTATIN 20 MG PO TABS
40.0000 mg | ORAL_TABLET | Freq: Every day | ORAL | Status: DC
  Administered 2021-12-31 – 2022-01-03 (×4): 40 mg via ORAL
  Filled 2021-12-31 (×4): qty 2

## 2021-12-31 MED ORDER — GALANTAMINE HYDROBROMIDE 4 MG PO TABS: 8.0000 mg | ORAL_TABLET | Freq: Every day | ORAL | Status: DC

## 2021-12-31 MED ORDER — MELATONIN 3 MG PO TABS
3.0000 mg | ORAL_TABLET | Freq: Every day | ORAL | Status: DC
  Administered 2021-12-31 – 2022-01-03 (×4): 3 mg via ORAL
  Filled 2021-12-31 (×4): qty 1

## 2021-12-31 MED ORDER — SENNOSIDES-DOCUSATE SODIUM 8.6-50 MG PO TABS: 1.0000 | ORAL_TABLET | Freq: Every evening | ORAL | Status: DC | PRN

## 2021-12-31 MED ORDER — CARBIDOPA-LEVODOPA 25-100 MG PO TABS
2.0000 | ORAL_TABLET | ORAL | Status: DC
  Administered 2021-12-31 – 2022-01-04 (×12): 2 via ORAL
  Filled 2021-12-31 (×12): qty 2

## 2021-12-31 NOTE — Progress Notes (Signed)
[facility] PROGRESS NOTE    Progress Note  [name]  [id number] DOB: 03/21/1936 DOA: 12/30/2021 PCP: [facility]     Brief Narrative:   Jose Gilbert is an 88 y.o. male past medical history significant for CAD, Parkinson's disease presents from ALF for evaluation of somnolence and anorexia, was seen in the ED 2 days prior to admission after a fall was evaluated and his mental status was at baseline so he was returned to ALF.  According to family has not been eating or drinking and appeared more sleepy daughter requested to be transferred back to the ED, CT of the head was negative for acute intracranial abnormalities or fractures creatinine was noted at 1.9, CBC showed mild thrombocytopenia   Assessment/Plan:   Acute renal failure superimposed on stage 3a chronic kidney disease ([name]) With a baseline creatinine around 1.21.4 on admission 1.9. Likely prerenal azotemia, due to decreased oral intake. Was started on IV fluid resuscitation. His creatinine is morning is 1.6 we will continue IV fluid hydration.  Acute metabolic encephalopathy: The admitting physician found to be the patient alert and oriented to person and place. CT of the head showed no acute findings. Possibly could be due to dehydration.  Continue fluid resuscitation. B12 TSH and ammonia level were unremarkable.  Parkinson's disease ([name]) Continue current medication.  CAD (coronary artery disease) To aspirin and statins.     DVT prophylaxis: lovenox Family Communication:Daughter Status is: Observation The patient will require care spanning > 2 midnights and should be moved to inpatient because: AKI    Code Status:     Code Status Orders  (From admission, onward)           Start     Ordered   12/31/21 0406  Full code  Continuous        12/31/21 0407           Code Status History     This patient has a current code status but no historical code status.          IV Access:   Peripheral IV   Procedures and diagnostic studies:   CT Head Wo Contrast  Result Date: 12/30/2021 CLINICAL DATA:  Fall injury.  Delirium.  Altered mental status. EXAM: CT HEAD WITHOUT CONTRAST TECHNIQUE: Contiguous axial images were obtained from the base of the skull through the vertex without intravenous contrast. RADIATION DOSE REDUCTION: This exam was performed according to the departmental dose-optimization program which includes automated exposure control, adjustment of the mA and/or kV according to patient size and/or use of iterative reconstruction technique. COMPARISON:  Head CT 12/28/2021. FINDINGS: Brain: There is moderate cerebral atrophy, small vessel disease and atrophic ventriculomegaly, without midline shift and with senescent calcification in the basal ganglia. Cerebellum and brainstem are unremarkable, as visualized. No asymmetry is seen worrisome for acute infarct, hemorrhage or mass. 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 There is no mass effect. Vascular: There calcifications of the carotid siphons and distal vertebral arteries but no hyperdense central vasculature. Skull: Intact calvarium, skull base and orbits. No focal bone lesion. No visible scalp hematoma. Sinuses/Orbits: No acute abnormality. No appreciable mastoid effusion. Old lens replacements. Other: None. IMPRESSION: Chronic changes. No acute intracranial CT findings, depressed skull fractures or interval changes. Electronically Signed   By: Telford Nab M.D.   On: 12/30/2021 21:15   DG Chest Portable 1 View  Result Date: 12/30/2021 CLINICAL DATA:  Altered mental status EXAM: PORTABLE CHEST  1 VIEW COMPARISON:  None Available. FINDINGS: Lungs are clear.  No pleural effusion or pneumothorax. The heart is normal in size. Median sternotomy. IMPRESSION: No evidence of acute cardiopulmonary disease. Electronically Signed   By: Julian Hy M.D.   On:  12/30/2021 21:18     Medical Consultants:   None.   Subjective:    [name] no complains  Objective:    Vitals:   12/30/21 2215 12/30/21 2300 12/31/21 0601 12/31/21 0631  BP: (!) 136/120 (!) 144/111 (!) 146/78 (!) 183/84  Pulse: 68 70 64 65  Resp: (!) 25 20 19 18   Temp:    98.3 F (36.8 C)  TempSrc:    Oral  SpO2:  100% 100% 100%   SpO2: 100 %   Intake/Output Summary (Last 24 hours) at 12/31/2021 [id number] Last data filed at 12/31/2021 0631 Gross per 24 hour  Intake 0 ml  Output 200 ml  Net -200 ml   There were no vitals filed for this visit.  Exam: General exam: In no acute distress. Respiratory system: Good air movement and clear to auscultation. Cardiovascular system: S1 & S2 heard, RRR. No JVD. Gastrointestinal system: Abdomen is nondistended, soft and nontender.  Extremities: No pedal edema. Skin: No rashes, lesions or ulcers Psychiatry: No judgement and insight appear normal.    Data Reviewed:    Labs: Basic Metabolic Panel: Recent Labs  Lab 12/28/21 2130 12/30/21 2121 12/31/21 0500  NA 146* 143 144  K 3.7 4.6 3.8  CL 115* 112* 110  CO2 24 26 25   GLUCOSE 116* 111* 107*  BUN 23 28* 25*  CREATININE 1.40* 1.95* 1.62*  CALCIUM 8.8* 9.1 8.9   GFR CrCl cannot be calculated (Unknown ideal weight.). Liver Function Tests: Recent Labs  Lab 12/28/21 2130 12/30/21 2121  AST 16 15  ALT 13 14  ALKPHOS 67 80  BILITOT 0.6 0.5  PROT 6.6 7.2  ALBUMIN 3.9 4.3   No results for input(s): LIPASE, AMYLASE in the last 168 hours. Recent Labs  Lab 12/31/21 0553  AMMONIA 25   Coagulation profile No results for input(s): INR, PROTIME in the last 168 hours. COVID-19 Labs  No results for input(s): DDIMER, FERRITIN, LDH, CRP in the last 72 hours.  Lab Results  Component Value Date   [place] NEGATIVE 12/30/2021    CBC: Recent Labs  Lab 12/28/21 2130 12/30/21 2121 12/31/21 0500  WBC 6.2 6.6 7.0  NEUTROABS 3.2 2.3  --   HGB 14.4 14.3 14.5   HCT 41.9 42.6 42.5  MCV 92.5 91.8 90.4  PLT 142* 142* 143*   Cardiac Enzymes: No results for input(s): CKTOTAL, CKMB, CKMBINDEX, TROPONINI in the last 168 hours. BNP (last 3 results) No results for input(s): PROBNP in the last 8760 hours. CBG: No results for input(s): GLUCAP in the last 168 hours. D-Dimer: No results for input(s): DDIMER in the last 72 hours. Hgb A1c: No results for input(s): HGBA1C in the last 72 hours. Lipid Profile: No results for input(s): CHOL, HDL, LDLCALC, TRIG, CHOLHDL, LDLDIRECT in the last 72 hours. Thyroid function studies: Recent Labs    12/30/21 2121  TSH 0.982   Anemia work up: Recent Labs    12/31/21 0500  VITAMINB12 751   Sepsis Labs: Recent Labs  Lab 12/28/21 2130 12/30/21 2121 12/31/21 0500  WBC 6.2 6.6 7.0   Microbiology Recent Results (from the past 240 hour(s))  Resp Panel by RT-PCR (Flu A&B, Covid) Nasopharyngeal Swab     Status: None  Collection Time: 12/30/21  8:57 PM   Specimen: Nasopharyngeal Swab; Nasopharyngeal(NP) swabs in vial transport medium  Result Value Ref Range Status   SARS Coronavirus 2 by RT PCR NEGATIVE NEGATIVE Final    Comment: (NOTE) SARS-CoV-2 target nucleic acids are NOT DETECTED.  The SARS-CoV-2 RNA is generally detectable in upper respiratory specimens during the acute phase of infection. The lowest concentration of SARS-CoV-2 viral copies this assay can detect is 138 copies/mL. A negative result does not preclude SARS-Cov-2 infection and should not be used as the sole basis for treatment or other patient management decisions. A negative result may occur with  improper specimen collection/handling, submission of specimen other than nasopharyngeal swab, presence of viral mutation(s) within the areas targeted by this assay, and inadequate number of viral copies(<138 copies/mL). A negative result must be combined with clinical observations, patient history, and epidemiological information. The  expected result is Negative.  Fact Sheet for Patients:  EntrepreneurPulse.com.au  Fact Sheet for Healthcare Providers:  IncredibleEmployment.be  This test is no t yet approved or cleared by the [place] FDA and  has been authorized for detection and/or diagnosis of SARS-CoV-2 by FDA under an Emergency Use Authorization (EUA). This EUA will remain  in effect (meaning this test can be used) for the duration of the COVID-19 declaration under Section 564(b)(1) of the Act, 21 U.S.C.section 360bbb-3(b)(1), unless the authorization is terminated  or revoked sooner.       Influenza A by PCR NEGATIVE NEGATIVE Final   Influenza B by PCR NEGATIVE NEGATIVE Final    Comment: (NOTE) The Xpert Xpress SARS-CoV-2/FLU/RSV plus assay is intended as an aid in the diagnosis of influenza from Nasopharyngeal swab specimens and should not be used as a sole basis for treatment. Nasal washings and aspirates are unacceptable for Xpert Xpress SARS-CoV-2/FLU/RSV testing.  Fact Sheet for Patients: EntrepreneurPulse.com.au  Fact Sheet for Healthcare Providers: IncredibleEmployment.be  This test is not yet approved or cleared by the [place] FDA and has been authorized for detection and/or diagnosis of SARS-CoV-2 by FDA under an Emergency Use Authorization (EUA). This EUA will remain in effect (meaning this test can be used) for the duration of the COVID-19 declaration under Section 564(b)(1) of the Act, 21 U.S.C. section 360bbb-3(b)(1), unless the authorization is terminated or revoked.  Performed at [facility], [place] [address] [place], [place] [address]      Medications:    aspirin EC  81 mg Oral Daily   carbidopa-levodopa  2 tablet Oral TID   galantamine  8 mg Oral QODAY   heparin  5,000 Units Subcutaneous Q8H   pantoprazole  40 mg Oral Daily   simvastatin  40 mg Oral q1800   Continuous  Infusions:  sodium chloride 100 mL/hr at 12/31/21 0631      LOS: 0 days   Charlynne Cousins  [facility]  12/31/2021, 7:28 AM

## 2021-12-31 NOTE — Progress Notes (Signed)
Pt's BP at this time is 183/84. Requested PRN Lobetolol from pharmacy. Endorsed to incoming RN.

## 2021-12-31 NOTE — Progress Notes (Signed)
Initial Nutrition Assessment  DOCUMENTATION CODES:   Severe malnutrition in context of chronic illness  INTERVENTION:   -Ensure Plus High Protein po BID, each supplement provides 350 kcal and 20 grams of protein.   -Multivitamin with minerals daily  NUTRITION DIAGNOSIS:   Severe Malnutrition related to chronic illness (Parkinsons, dementia) as evidenced by severe fat depletion, severe muscle depletion.  GOAL:   Patient will meet greater than or equal to 90% of their needs  MONITOR:   PO intake, Supplement acceptance, Labs, Weight trends, I & O's  REASON FOR ASSESSMENT:   Consult Assessment of nutrition requirement/status  ASSESSMENT:   88 y.o. male past medical history significant for CAD, Parkinson's disease presents from ALF for evaluation of somnolence and anorexia, was seen in the ED 2 days prior to admission after a fall was evaluated and his mental status was at baseline so he was returned to ALF.  According to family has not been eating or drinking and appeared more sleepy daughter requested to be transferred back to the ED. Admitted for Acute renal failure.  Patient confused at bedside, was unable to answer most of my questions. No family present at time of visit.  Per chart review, family had reported pt has not been eating or drinking well. Has been more lethargic.  SLP evaluated today and recommended dysphagia 3 diet given pt is missing his dentures.  Pt did eat ~60% of breakfast this morning, could not recall what he had.   Pt agreeable to drinking Ensure supplements, have ordered as well as a daily MVI.  Last recorded weight was from [place] records: 137 lbs on 10/16/21.  Noted that daily weights have been ordered for patient this admission.  Medications reviewed.  Labs reviewed.  NUTRITION - FOCUSED PHYSICAL EXAM:  Flowsheet Row Most Recent Value  Orbital Region Severe depletion  [right orbital swollen and bruised]  Upper Arm Region Moderate depletion   Thoracic and Lumbar Region Moderate depletion  Buccal Region Severe depletion  Temple Region Moderate depletion  Clavicle Bone Region Severe depletion  Clavicle and Acromion Bone Region Severe depletion  Scapular Bone Region Severe depletion  Dorsal Hand Severe depletion  Patellar Region Severe depletion  Anterior Thigh Region Severe depletion  Posterior Calf Region Severe depletion  Edema (RD Assessment) None  Hair Reviewed  [thin]  Eyes Reviewed  Mouth Reviewed  [missing some teeth, states he has his dentures]  Skin Reviewed  [bruised]       Diet Order:   Diet Order             DIET DYS 3 Room service appropriate? Yes; Fluid consistency: Thin  Diet effective now                   EDUCATION NEEDS:   No education needs have been identified at this time  Skin:  Skin Assessment: Reviewed RN Assessment  Last BM:  PTA  Height:   Ht Readings from Last 1 Encounters:  No data found for Ht  Per records from [place]: 5'8" (68.5 in)  Weight:   Wt Readings from Last 1 Encounters:  No data found for Wt  Last weight from [place]: 137 lbs on 10/16/21   BMI:  There is no height or weight on file to calculate BMI.  Estimated Nutritional Needs:   Kcal:  1750-1950  Protein:  75-85g  Fluid:  1.9L/day   Tilda Franco, MS, RD, LDN Inpatient Clinical Dietitian Contact information available via Amion

## 2021-12-31 NOTE — H&P (Signed)
History and Physical    Jose Gilbert ZOX:[id number] DOB: 03/21/1936 DOA: 12/30/2021  PCP: [facility]   Patient coming from: ALF   Chief Complaint: Somnolence, not eating or drinking   HPI: Jose Gilbert is a pleasant 88 y.o. male with medical history significant for CAD, Parkinson disease, hypertension, and depression, now presenting from his ALF for evaluation of somnolence and anorexia.  Patient was seen in the ED 2 days earlier after a fall, was said by his daughter to be at his baseline mental status then, and returned to his ALF.  He has reportedly been refusing to eat or drink back at the ALF and appeared to be more sleepy and disoriented to his daughter who requested transfer to the ED for evaluation.  Patient has no complaints and denies that he has been refusing to eat or drink.  ED Course: Upon arrival to the ED, patient is found to be afebrile and saturating well on room air with elevated blood pressure.  EKG features sinus rhythm with first-degree AV nodal block.  Chest x-ray negative for acute cardiopulmonary disease.  Head CT negative for acute intracranial abnormality or depressed skull fracture.  Chemistry panel notable for creatinine of 1.95.  CBC with mild thrombocytopenia.  Troponin was normal.  TSH normal.  Patient was given a liter of LR in the ED.  Review of Systems:  ROS limited by the patient's clinical condition.  Past Medical History:  Diagnosis Date   CAD (coronary artery disease) 12/31/2021   Hypertension    Parkinson's disease ([facility])     History reviewed. No pertinent surgical history.  Social History:   reports that he does not currently use alcohol. He reports that he does not currently use drugs. No history on file for tobacco use.  Allergies  Allergen Reactions   Benadryl [Diphenhydramine]    Imdur [Isosorbide Nitrate]    Iodinated Contrast Media    Mirtazapine    Penicillins    Shellfish Allergy    Tramadol     History reviewed. No  pertinent family history.   Prior to Admission medications   Not on File    Physical Exam: Vitals:   12/30/21 2021 12/30/21 2215 12/30/21 2300  BP: (!) 188/83 (!) 136/120 (!) 144/111  Pulse: 92 68 70  Resp: 16 (!) 25 20  Temp: 98.8 F (37.1 C)    TempSrc: Rectal    SpO2: 100%  100%     Constitutional: NAD, calm  Eyes: PERTLA, periorbital ecchymosis on right ENMT: Mucous membranes are moist. Posterior pharynx clear of any exudate or lesions.   Neck: supple, no masses  Respiratory: no wheezing, no crackles. No accessory muscle use.  Cardiovascular: S1 & S2 heard, regular rate and rhythm. No significant JVD. Abdomen: No distension, no tenderness, soft. Bowel sounds active.  Musculoskeletal: no clubbing / cyanosis. No joint deformity upper and lower extremities.   Skin: no significant rashes, lesions, ulcers. Warm, dry, well-perfused. Neurologic: CN 2-12 grossly intact. Moving all extremities. Alert and oriented to person and place only.  Psychiatric: Pleasant. Cooperative.    Labs and Imaging on Admission: I have personally reviewed following labs and imaging studies  CBC: Recent Labs  Lab 12/28/21 2130 12/30/21 2121  WBC 6.2 6.6  NEUTROABS 3.2 2.3  HGB 14.4 14.3  HCT 41.9 42.6  MCV 92.5 91.8  PLT 142* 142*   Basic Metabolic Panel: Recent Labs  Lab 12/28/21 2130 12/30/21 2121  NA 146* 143  K 3.7 4.6  CL 115*  112*  CO2 24 26  GLUCOSE 116* 111*  BUN 23 28*  CREATININE 1.40* 1.95*  CALCIUM 8.8* 9.1   GFR: CrCl cannot be calculated (Unknown ideal weight.). Liver Function Tests: Recent Labs  Lab 12/28/21 2130 12/30/21 2121  AST 16 15  ALT 13 14  ALKPHOS 67 80  BILITOT 0.6 0.5  PROT 6.6 7.2  ALBUMIN 3.9 4.3   No results for input(s): LIPASE, AMYLASE in the last 168 hours. No results for input(s): AMMONIA in the last 168 hours. Coagulation Profile: No results for input(s): INR, PROTIME in the last 168 hours. Cardiac Enzymes: No results for  input(s): CKTOTAL, CKMB, CKMBINDEX, TROPONINI in the last 168 hours. BNP (last 3 results) No results for input(s): PROBNP in the last 8760 hours. HbA1C: No results for input(s): HGBA1C in the last 72 hours. CBG: No results for input(s): GLUCAP in the last 168 hours. Lipid Profile: No results for input(s): CHOL, HDL, LDLCALC, TRIG, CHOLHDL, LDLDIRECT in the last 72 hours. Thyroid Function Tests: Recent Labs    12/30/21 2121  TSH 0.982   Anemia Panel: No results for input(s): VITAMINB12, FOLATE, FERRITIN, TIBC, IRON, RETICCTPCT in the last 72 hours. Urine analysis:    Component Value Date/Time   COLORURINE STRAW (A) 12/30/2021 2054   APPEARANCEUR CLEAR 12/30/2021 2054   LABSPEC 1.009 12/30/2021 2054   PHURINE 6.0 12/30/2021 2054   GLUCOSEU NEGATIVE 12/30/2021 2054   HGBUR NEGATIVE 12/30/2021 2054   BILIRUBINUR NEGATIVE 12/30/2021 2054   KETONESUR NEGATIVE 12/30/2021 2054   PROTEINUR NEGATIVE 12/30/2021 2054   NITRITE NEGATIVE 12/30/2021 2054   LEUKOCYTESUR NEGATIVE 12/30/2021 2054   Sepsis Labs: @LABRCNTIP (procalcitonin:4,lacticidven:4) ) Recent Results (from the past 240 hour(s))  Resp Panel by RT-PCR (Flu A&B, Covid) Nasopharyngeal Swab     Status: None   Collection Time: 12/30/21  8:57 PM   Specimen: Nasopharyngeal Swab; Nasopharyngeal(NP) swabs in vial transport medium  Result Value Ref Range Status   SARS Coronavirus 2 by RT PCR NEGATIVE NEGATIVE Final    Comment: (NOTE) SARS-CoV-2 target nucleic acids are NOT DETECTED.  The SARS-CoV-2 RNA is generally detectable in upper respiratory specimens during the acute phase of infection. The lowest concentration of SARS-CoV-2 viral copies this assay can detect is 138 copies/mL. A negative result does not preclude SARS-Cov-2 infection and should not be used as the sole basis for treatment or other patient management decisions. A negative result may occur with  improper specimen collection/handling, submission of specimen  other than nasopharyngeal swab, presence of viral mutation(s) within the areas targeted by this assay, and inadequate number of viral copies(<138 copies/mL). A negative result must be combined with clinical observations, patient history, and epidemiological information. The expected result is Negative.  Fact Sheet for Patients:  01/01/22  Fact Sheet for Healthcare Providers:  BloggerCourse.com  This test is no t yet approved or cleared by the [place] FDA and  has been authorized for detection and/or diagnosis of SARS-CoV-2 by FDA under an Emergency Use Authorization (EUA). This EUA will remain  in effect (meaning this test can be used) for the duration of the COVID-19 declaration under Section 564(b)(1) of the Act, 21 U.S.C.section 360bbb-3(b)(1), unless the authorization is terminated  or revoked sooner.       Influenza A by PCR NEGATIVE NEGATIVE Final   Influenza B by PCR NEGATIVE NEGATIVE Final    Comment: (NOTE) The Xpert Xpress SARS-CoV-2/FLU/RSV plus assay is intended as an aid in the diagnosis of influenza from Nasopharyngeal swab specimens and  should not be used as a sole basis for treatment. Nasal washings and aspirates are unacceptable for Xpert Xpress SARS-CoV-2/FLU/RSV testing.  Fact Sheet for Patients: BloggerCourse.comhttps://www.fda.gov/media/152166/download  Fact Sheet for Healthcare Providers: SeriousBroker.ithttps://www.fda.gov/media/152162/download  This test is not yet approved or cleared by the [place] FDA and has been authorized for detection and/or diagnosis of SARS-CoV-2 by FDA under an Emergency Use Authorization (EUA). This EUA will remain in effect (meaning this test can be used) for the duration of the COVID-19 declaration under Section 564(b)(1) of the Act, 21 U.S.C. section 360bbb-3(b)(1), unless the authorization is terminated or revoked.  Performed at [facility], [address] [place], [place] [address]      Radiological Exams on Admission: CT Head Wo Contrast  Result Date: 12/30/2021 CLINICAL DATA:  Fall injury.  Delirium.  Altered mental status. EXAM: CT HEAD WITHOUT CONTRAST TECHNIQUE: Contiguous axial images were obtained from the base of the skull through the vertex without intravenous contrast. RADIATION DOSE REDUCTION: This exam was performed according to the departmental dose-optimization program which includes automated exposure control, adjustment of the mA and/or kV according to patient size and/or use of iterative reconstruction technique. COMPARISON:  Head CT 12/28/2021. FINDINGS: Brain: There is moderate cerebral atrophy, small vessel disease and atrophic ventriculomegaly, without midline shift and with senescent calcification in the basal ganglia. Cerebellum and brainstem are unremarkable, as visualized. No asymmetry is seen worrisome for acute infarct, hemorrhage or mass. 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 There is no mass effect. Vascular: There calcifications of the carotid siphons and distal vertebral arteries but no hyperdense central vasculature. Skull: Intact calvarium, skull base and orbits. No focal bone lesion. No visible scalp hematoma. Sinuses/Orbits: No acute abnormality. No appreciable mastoid effusion. Old lens replacements. Other: None. IMPRESSION: Chronic changes. No acute intracranial CT findings, depressed skull fractures or interval changes. Electronically Signed   By: Almira BarKeith  Chesser M.D.   On: 12/30/2021 21:15   DG Chest Portable 1 View  Result Date: 12/30/2021 CLINICAL DATA:  Altered mental status EXAM: PORTABLE CHEST 1 VIEW COMPARISON:  None Available. FINDINGS: Lungs are clear.  No pleural effusion or pneumothorax. The heart is normal in size. Median sternotomy. IMPRESSION: No evidence of acute cardiopulmonary disease. Electronically Signed   By: Charline BillsSriyesh  Krishnan M.D.   On: 12/30/2021  21:18    EKG: Independently reviewed. Sinus rhythm, 1st degree AV block.   Assessment/Plan   1. AKI superimposed on CKD IIIa  - Presents with somnolence, disorientation, and refusing meals and is found to have SCr of 1.95, up from 1.40 two days earlier  - Likely acute prerenal azotemia in setting of refusing food and drink  - He was given a liter of NS in ED  - Continue IVF hydration, renally-dose medications, repeat chem panel    2. Acute encephalopathy  - Presents with somnolence and refusing meals, found to be oriented to person and place only   - TSH is normal in the ED, no acute findings noted on head CT, and no infectious s/s  - Possibly from dehydration  - Continue IVF hydration, use delirium precautions, and check ammonia, RPR, and B12  3. Parkinson disease  - Continue Sinemet and galantamine   4. CAD  - No anginal complaints  - Continue ASA and statin    DVT prophylaxis: sq heparin  Code Status: Full  Level of Care: Level of care: Med-Surg Family Communication: none present  Disposition Plan:  Patient is  from: ALF  Anticipated d/c is to: TBD Anticipated d/c date is: 5/19 or 01/02/22  Patient currently: pending improved/stable renal function, tolerance of adequate oral intake  Consults called: none  Admission status: Observation     Briscoe Deutscher, MD [facility]  12/31/2021, 5:57 AM

## 2021-12-31 NOTE — Evaluation (Signed)
Clinical/Bedside Swallow Evaluation Patient Details  Name: Jose Gilbert MRN: [id number] Date of Birth: 03/21/1936  Today's Date: 12/31/2021 Time: SLP Start Time (ACUTE ONLY): 1110 SLP Stop Time (ACUTE ONLY): 1108 SLP Time Calculation (min) (ACUTE ONLY): 1438 min  Past Medical History:  Past Medical History:  Diagnosis Date   CAD (coronary artery disease) 12/31/2021   Hypertension    Parkinson's disease (HCC)    Past Surgical History: History reviewed. No pertinent surgical history. HPI:  Jose Gilbert is a pleasant 88 y.o. male with medical history significant for CAD, Parkinson disease, hypertension, and depression, now presenting from his ALF for evaluation of somnolence and anorexia.  Patient was seen in the ED 2 days earlier after a fall, was said by his daughter to be at his baseline mental status then, and returned to his ALF.  He has reportedly been refusing to eat or drink back at the ALF and appeared to be more sleepy and disoriented to his daughter who requested transfer to the ED for evaluation.Admitted with AKI superimposed on CKD. No SLP notes.    Assessment / Plan / Recommendation  Clinical Impression  Pt accepted sips of water and bites of applesauce without hesitation. No signs of aspiration. Also ate his breakfast today. Pt would not try graham cracker because he didnt have his denture and said it would hurt to chew it, which is reasonable. Will downgrade to mechanical soft for pts comfort. No SLP f/u needed will sign off. SLP Visit Diagnosis: Dysphagia, unspecified (R13.10)    Aspiration Risk  Risk for inadequate nutrition/hydration    Diet Recommendation Dysphagia 3 (Mech soft);Thin liquid   Liquid Administration via: Cup;Straw Medication Administration: Whole meds with liquid Supervision: Patient able to self feed    Other  Recommendations      Recommendations for follow up therapy are one component of a multi-disciplinary discharge planning process, led by the  attending physician.  Recommendations may be updated based on patient status, additional functional criteria and insurance authorization.  Follow up Recommendations        Assistance Recommended at Discharge    Functional Status Assessment    Frequency and Duration            Prognosis        Swallow Study   General HPI: Jose Gilbert is a pleasant 88 y.o. male with medical history significant for CAD, Parkinson disease, hypertension, and depression, now presenting from his ALF for evaluation of somnolence and anorexia.  Patient was seen in the ED 2 days earlier after a fall, was said by his daughter to be at his baseline mental status then, and returned to his ALF.  He has reportedly been refusing to eat or drink back at the ALF and appeared to be more sleepy and disoriented to his daughter who requested transfer to the ED for evaluation.Admitted with AKI superimposed on CKD. No SLP notes. Type of Study: Bedside Swallow Evaluation Diet Prior to this Study: Regular;Thin liquids Temperature Spikes Noted: No Respiratory Status: Room air History of Recent Intubation: No Behavior/Cognition: Alert;Cooperative;Pleasant mood Oral Cavity Assessment: Within Functional Limits Oral Care Completed by SLP: No Oral Cavity - Dentition: Edentulous;Dentures, not available Vision: Functional for self-feeding Self-Feeding Abilities: Able to feed self Patient Positioning: Upright in bed Baseline Vocal Quality: Normal Volitional Cough: Strong Volitional Swallow: Able to elicit    Oral/Motor/Sensory Function Overall Oral Motor/Sensory Function: Within functional limits   Ice Chips     Thin Liquid Thin Liquid: Within functional limits  Nectar Thick Nectar Thick Liquid: Not tested   Honey Thick Honey Thick Liquid: Not tested   Puree Puree: Within functional limits   Solid     Solid: Not tested      Claudine Mouton 12/31/2021,11:26 AM

## 2021-12-31 NOTE — Evaluation (Signed)
Occupational Therapy Evaluation Patient Details Name: Jose Gilbert MRN: [id number] DOB: 03/21/1936 Today's Date: 12/31/2021   History of Present Illness Jose Gilbert is a pleasant 88 y.o. male with medical history significant for CAD, Parkinson disease, hypertension, and depression, now presenting from his ALF for evaluation of somnolence and anorexia.  Patient was seen in the ED 2 days earlier after a fall, was said by his daughter to be at his baseline mental status then, and returned to his ALF.  He has reportedly been refusing to eat or drink back at the ALF and appeared to be more sleepy and disoriented to his daughter who requested transfer to the ED for evaluation.   Clinical Impression   Patient is currently requiring assistance with ADLs including Minimal assist with standing Lower body ADLs, supervision/setup assist with Upper body ADLs,  as well as  supervision with bed mobility and Min guard assist with functional transfers to toilet.  Current level of function may be below patient's typical baseline which is unknown as pt is an unreliable historian .  During this evaluation, patient was limited by generalized weakness, impaired activity tolerance, and confusion with decreased awareness of impairments and decreased judgment as well as disorientation for all but person, including giving incorrect DOB (Pt insisting his DOB is 03/23/1929) (?), all of which has the potential to impact patient's safety and independence during functional mobility, as well as performance for ADLs.  Patient lives at [facility] per chart.  Patient demonstrates fair rehab potential, and should benefit from continued skilled occupational therapy services while in acute care to maximize safety, independence and quality of life at home.  Continued occupational therapy services in the home is recommended with 24/7 supervision due to cognitive and judgment impairments.   If ALF cannot accept pt back with current needs, pt may  need to consider postacute inpatient therapy at a SNF. Will update as needed.  ?     Recommendations for follow up therapy are one component of a multi-disciplinary discharge planning process, led by the attending physician.  Recommendations may be updated based on patient status, additional functional criteria and insurance authorization.   Follow Up Recommendations  Home health OT    Assistance Recommended at Discharge Frequent or constant Supervision/Assistance  Patient can return home with the following A little help with walking and/or transfers;A little help with bathing/dressing/bathroom;Assist for transportation;Assistance with cooking/housework;Direct supervision/assist for financial management;Direct supervision/assist for medications management    Functional Status Assessment  Patient has had a recent decline in their functional status and demonstrates the ability to make significant improvements in function in a reasonable and predictable amount of time.  Equipment Recommendations   (Pt only endorses having a cane. May need to clarify with family when they are available.)    Recommendations for Other Services PT consult     Precautions / Restrictions Precautions Precautions: Fall Restrictions Weight Bearing Restrictions: No      Mobility Bed Mobility Overal bed mobility: Needs Assistance Bed Mobility: Supine to Sit     Supine to sit: Supervision, HOB elevated          Transfers                          Balance Overall balance assessment: History of Falls, Mild deficits observed, not formally tested  ADL either performed or assessed with clinical judgement   ADL Overall ADL's : Needs assistance/impaired Eating/Feeding: Set up;Sitting Eating/Feeding Details (indicate cue type and reason): Pt in bed with lunch and initially declined chair. Pt heard coughing after a bite and was repositioned  upright in chair for swallow safety. Pt required assistance opening all containers, but able to feed self. Grooming: Wash/dry hands;Set up;Sitting   Upper Body Bathing: Set up;Sitting;Supervision/ safety;Cueing for sequencing   Lower Body Bathing: Minimal assistance;Sit to/from stand;Cueing for safety;Cueing for sequencing   Upper Body Dressing : Minimal assistance;Sitting   Lower Body Dressing: Minimal assistance;Supervision/safety;Set up;Sitting/lateral leans;Sit to/from stand Lower Body Dressing Details (indicate cue type and reason): Pt able to perform figure 4 in recliner and doff/don socks with supervision/setup. Min As needed with LE dressing in standing.   Toilet Transfer Details (indicate cue type and reason): Pt stood from EOB with Min guard assist and used RW to pivot to recliner with Min Guard assist. Cues for hand placement with both ascent and descent. Toileting- Clothing Manipulation and Hygiene: Minimal assistance;Sitting/lateral lean;Sit to/from stand Toileting - Clothing Manipulation Details (indicate cue type and reason): Based on general assessment.     Functional mobility during ADLs: Min guard;Cueing for sequencing;Rolling walker (2 wheels);Cueing for safety       Vision Ability to See in Adequate Light: 0 Adequate Vision Assessment?: No apparent visual deficits Additional Comments: RT orbital bruising which pt reports is from a recent fall. Able to read small clock on wall.     Perception     Praxis      Pertinent Vitals/Pain Pain Assessment Pain Assessment: No/denies pain     Hand Dominance Right   Extremity/Trunk Assessment Upper Extremity Assessment Upper Extremity Assessment: Generalized weakness;RUE deficits/detail;LUE deficits/detail RUE Deficits / Details: Noted pt with compesatory upper trap elevation when reaching for food items on try. AROM WFL, but MMT to shoulder 3-/5, grip WFL LUE Deficits / Details: Grossly 3+/5 proximal and WFL distal.            Communication Communication Communication: HOH   Cognition Arousal/Alertness: Awake/alert Behavior During Therapy: WFL for tasks assessed/performed Overall Cognitive Status: No family/caregiver present to determine baseline cognitive functioning Area of Impairment: Orientation, Memory, Following commands, Awareness, Safety/judgement                 Orientation Level: Disoriented to, Place, Time, Situation   Memory: Decreased short-term memory Following Commands: Follows one step commands inconsistently (Severely HOH) Safety/Judgement: Decreased awareness of deficits Awareness: Emergent         General Comments  Impulsive with confusion.    Exercises     Shoulder Instructions      Home Living Family/patient expects to be discharged to:: Assisted living                             Home Equipment: Gilmer Mor - single point;Shower seat   Additional Comments: Pt from [facility]      Prior Functioning/Environment Prior Level of Function : Needs assist  Cognitive Assist : ADLs (cognitive)   ADLs (Cognitive): Set up cues     ADLs (physical): IADLs Mobility Comments: Pt reports walking with his cane at all times and endorses one recent fall with subsequent RT orbit bruising. ADLs Comments: Pt reports that he completed his basic ADLs without staff assistance, however pt is confused and disoriented and history is considered unreliable.        OT  Problem List: Decreased strength;Decreased cognition;Decreased activity tolerance;Decreased safety awareness;Impaired balance (sitting and/or standing);Decreased knowledge of use of DME or AE      OT Treatment/Interventions: Self-care/ADL training;Therapeutic exercise;Therapeutic activities;DME and/or AE instruction;Patient/family education;Balance training    OT Goals(Current goals can be found in the care plan section) Acute Rehab OT Goals Patient Stated Goal: "Don't know" OT Goal Formulation:  Patient unable to participate in goal setting Time For Goal Achievement: 01/14/22 Potential to Achieve Goals: Fair ADL Goals Pt Will Perform Grooming: standing;with supervision (3/3) Pt Will Perform Upper Body Bathing: with supervision;sitting Pt Will Perform Lower Body Bathing: with supervision;sitting/lateral leans;sit to/from stand Pt Will Transfer to Toilet: ambulating;with modified independence Additional ADL Goal #1: Patient will tolerate BUE home exercise program 10-15 reps x 2 sets within pain-free ranges, in an unsupported seated position, in order to improve upper body strength, endurance and core stability needed to complete home ADLs. Additional ADL Goal #2: Pt will engage in at least 10 min of functional activities without loss of balance sitting or standing, in order to demonstrate improved activity tolerance and balance needed to perform ADLs safely at home.  OT Frequency: Min 2X/week    Co-evaluation              AM-PAC OT "6 Clicks" Daily Activity     Outcome Measure Help from another person eating meals?: A Little Help from another person taking care of personal grooming?: A Little Help from another person toileting, which includes using toliet, bedpan, or urinal?: A Little Help from another person bathing (including washing, rinsing, drying)?: A Little Help from another person to put on and taking off regular upper body clothing?: A Little Help from another person to put on and taking off regular lower body clothing?: A Little 6 Click Score: 18   End of Session Equipment Utilized During Treatment: Rolling walker (2 wheels) Nurse Communication: Other (comment) (CNA provided chair alarm and warned that pt jumps up without warning/impulsive)  Activity Tolerance: Patient tolerated treatment well Patient left: in chair;with call bell/phone within reach;with chair alarm set  OT Visit Diagnosis: History of falling (Z91.81);Unsteadiness on feet (R26.81);Other symptoms  and signs involving cognitive function                Time: [id number] OT Time Calculation (min): 20 min Charges:  OT General Charges $OT Visit: 1 Visit OT Evaluation $OT Eval Low Complexity: 1 Low  Passion Lavin, OT Acute Rehab Services Office: [phone] 12/31/2021   Julien Girt 12/31/2021, 12:25 PM

## 2022-01-01 DIAGNOSIS — N1831 Chronic kidney disease, stage 3a: Secondary | ICD-10-CM | POA: Diagnosis not present

## 2022-01-01 DIAGNOSIS — N179 Acute kidney failure, unspecified: Secondary | ICD-10-CM | POA: Diagnosis not present

## 2022-01-01 LAB — CBC
HCT: 38.6 % — ABNORMAL LOW (ref 39.0–52.0)
Hemoglobin: 13.1 g/dL (ref 13.0–17.0)
MCH: 31.3 pg (ref 26.0–34.0)
MCHC: 33.9 g/dL (ref 30.0–36.0)
MCV: 92.3 fL (ref 80.0–100.0)
Platelets: 136 10*3/uL — ABNORMAL LOW (ref 150–400)
RBC: 4.18 MIL/uL — ABNORMAL LOW (ref 4.22–5.81)
RDW: 13.2 % (ref 11.5–15.5)
WBC: 4.7 10*3/uL (ref 4.0–10.5)
nRBC: 0 % (ref 0.0–0.2)

## 2022-01-01 LAB — BASIC METABOLIC PANEL
Anion gap: 7 (ref 5–15)
BUN: 25 mg/dL — ABNORMAL HIGH (ref 8–23)
CO2: 22 mmol/L (ref 22–32)
Calcium: 8.7 mg/dL — ABNORMAL LOW (ref 8.9–10.3)
Chloride: 115 mmol/L — ABNORMAL HIGH (ref 98–111)
Creatinine, Ser: 1.55 mg/dL — ABNORMAL HIGH (ref 0.61–1.24)
GFR, Estimated: 44 mL/min — ABNORMAL LOW (ref >60)
Glucose, Bld: 103 mg/dL — ABNORMAL HIGH (ref 70–99)
Potassium: 3.7 mmol/L (ref 3.5–5.1)
Sodium: 144 mmol/L (ref 135–145)

## 2022-01-01 NOTE — TOC Initial Note (Signed)
Transition of Care [facility]) - Initial/Assessment Note   Patient Details  Name: Jose Gilbert MRN: [id number] Date of Birth: 03/21/1936  [facility] of [facility]) CM/SW Contact:    Sherie Don, LCSW Phone Number: 01/01/2022, 3:57 PM  Clinical Narrative: PT/OT recommended HH. CSW spoke with patient's daughter, Maudie Mercury, to confirm patient is from [facility]. Per daughter, the plan is for him to discharge back to the facility where he is active with [facility]. CSW spoke with Anne Ng at [facility] [phone]) and confirmed the patient can return tomorrow, but only if he is able to tolerate a regular diet with only his meat being cut as the ALF does not provide dysphagia diets. Hospitalist notified about diet issue. Per Anne Ng, patient is active with [facility] for HHPT and will need new orders at discharge. FL2 started. CSW notified Levada Dy with [facility] regarding [facility].  Expected Discharge Plan: Assisted Living Barriers to Discharge: Continued Medical Work up  Patient Goals and CMS Choice Patient states their goals for this hospitalization and ongoing recovery are:: Return to [facility] CMS Medicare.gov Compare Post Acute Care list provided to:: Patient Represenative (must comment) Choice offered to / list presented to : Adult Children  Expected Discharge Plan and Services Expected Discharge Plan: Assisted Living In-house Referral: Clinical Social Work Post Acute Care Choice: [place] arrangements for the past 2 months: [place]            DME Arranged: N/A DME Agency: NA HH Arranged: PT, OT [place] Agency: [place] Date [place]: 01/01/22 Representative spoke with at [place]: Levada Dy  Prior Living Arrangements/Services Living arrangements for the past 2 months: [place] Lives with:: Facility Resident Patient language and need for interpreter reviewed:: Yes Do you feel safe going back to the place  where you live?: Yes      Need for Family Participation in Patient Care: Yes (Comment) (Patient is oriented to self only.) Care giver support system in place?: Yes (comment) Current home services: Home PT ([facility] currently providing HHPT) Criminal Activity/Legal Involvement Pertinent to Current Situation/Hospitalization: No - Comment as needed  Activities of Daily Living Home Assistive Devices/Equipment: Other (Comment) (pt from a SNF) ADL Screening (condition at time of admission) Patient's cognitive ability adequate to safely complete daily activities?: No Is the patient deaf or have difficulty hearing?: Yes Does the patient have difficulty seeing, even when wearing glasses/contacts?: No Does the patient have difficulty concentrating, remembering, or making decisions?: Yes Patient able to express need for assistance with ADLs?: No Does the patient have difficulty dressing or bathing?: Yes Independently performs ADLs?: No Communication: Needs assistance Is this a change from baseline?: Pre-admission baseline Dressing (OT): Needs assistance Is this a change from baseline?: Pre-admission baseline Grooming: Needs assistance Is this a change from baseline?: Pre-admission baseline Feeding: Needs assistance Is this a change from baseline?: Pre-admission baseline Bathing: Needs assistance Is this a change from baseline?: Pre-admission baseline Toileting: Needs assistance Is this a change from baseline?: Pre-admission baseline In/Out Bed: Needs assistance Is this a change from baseline?: Pre-admission baseline Walks in Home: Needs assistance Is this a change from baseline?: Pre-admission baseline Does the patient have difficulty walking or climbing stairs?: Yes Weakness of Legs: Both Weakness of Arms/Hands: Both  Permission Sought/Granted Permission sought to share information with : Other (comment) Permission granted to share information with : Yes, Verbal Permission  Granted Permission granted to share info w AGENCY: [facility]  Emotional Assessment Orientation: : Oriented to  Self  Admission diagnosis:  Dehydration [E86.0] AKI (acute kidney injury) ([place]) [N17.9] Injury of head, initial encounter [S09.90XA] Other fatigue [R53.83] Acute renal failure superimposed on stage 3a chronic kidney disease ([place]) [N17.9, N18.31] Patient Active Problem List   Diagnosis Date Noted   Acute renal failure superimposed on stage 3a chronic kidney disease ([place]) 12/31/2021   CAD (coronary artery disease) 12/31/2021   AKI (acute kidney injury) ([place]) 12/31/2021   Protein-calorie malnutrition, severe 12/31/2021   Parkinson's disease ([place])    Acute encephalopathy    Hypertension    PCP:  Clinic, [place]:  No Pharmacies Listed  Readmission Risk Interventions     View : No data to display.

## 2022-01-01 NOTE — Progress Notes (Signed)
[facility] PROGRESS NOTE    Progress Note  Jose Gilbert  [id number] DOB: 03/21/1936 DOA: 12/30/2021 [facility]     Brief Narrative:   Jose Gilbert is an 88 y.o. male past medical history significant for CAD, Parkinson's disease presents from ALF for evaluation of somnolence and anorexia, was seen in the ED 2 days prior to admission after a fall was evaluated and his mental status was at baseline so he was returned to ALF.  According to family has not been eating or drinking and appeared more sleepy daughter requested to be transferred back to the ED, CT of the head was negative for acute intracranial abnormalities or fractures creatinine was noted at 1.9, CBC showed mild thrombocytopenia   Assessment/Plan:   Acute renal failure superimposed on stage 3a chronic kidney disease (HCC) With a baseline creatinine around 1.21.4 on admission 1.9. Likely prerenal azotemia, due to decreased oral intake. Allow oral hydration his creatinine has returned close to baseline. Speech evaluated the patient recommended dysphagia 3 diet.  Acute metabolic encephalopathy: The admitting physician found to be the patient alert and oriented to person and place. CT of the head showed no acute findings. Possibly could be due to dehydration.   B12 700 TSH 0.9 . Required four-point restraints due to agitation but is now off restraints and stable.  Parkinson's disease (HCC) Continue current medication.  CAD (coronary artery disease) To aspirin and statins.     DVT prophylaxis: lovenox Family Communication:Daughter Status is: Observation The patient will require care spanning > 2 midnights and should be moved to inpatient because: AKI Probably home tomorrow morning.   Code Status:     Code Status Orders  (From admission, onward)           Start     Ordered   12/31/21 0406  Full code  Continuous        12/31/21 0407           Code Status History     This  patient has a current code status but no historical code status.         IV Access:   Peripheral IV   Procedures and diagnostic studies:   CT Head Wo Contrast  Result Date: 12/30/2021 CLINICAL DATA:  Fall injury.  Delirium.  Altered mental status. EXAM: CT HEAD WITHOUT CONTRAST TECHNIQUE: Contiguous axial images were obtained from the base of the skull through the vertex without intravenous contrast. RADIATION DOSE REDUCTION: This exam was performed according to the departmental dose-optimization program which includes automated exposure control, adjustment of the mA and/or kV according to patient size and/or use of iterative reconstruction technique. COMPARISON:  Head CT 12/28/2021. FINDINGS: Brain: There is moderate cerebral atrophy, small vessel disease and atrophic ventriculomegaly, without midline shift and with senescent calcification in the basal ganglia. Cerebellum and brainstem are unremarkable, as visualized. No asymmetry is seen worrisome for acute infarct, hemorrhage or mass. 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 There is no mass effect. Vascular: There calcifications of the carotid siphons and distal vertebral arteries but no hyperdense central vasculature. Skull: Intact calvarium, skull base and orbits. No focal bone lesion. No visible scalp hematoma. Sinuses/Orbits: No acute abnormality. No appreciable mastoid effusion. Old lens replacements. Other: None. IMPRESSION: Chronic changes. No acute intracranial CT findings, depressed skull fractures or interval changes. Electronically Signed   By: Almira BarKeith  Chesser M.D.   On: 12/30/2021 21:15   DG Chest Portable 1 View  Result Date: 12/30/2021 CLINICAL DATA:  Altered mental status EXAM: PORTABLE CHEST 1 VIEW COMPARISON:  None Available. FINDINGS: Lungs are clear.  No pleural effusion or pneumothorax. The heart is normal in size. Median sternotomy. IMPRESSION: No evidence of acute  cardiopulmonary disease. Electronically Signed   By: Julian Hy M.D.   On: 12/30/2021 21:18     Medical Consultants:   None.   Subjective:    Jose Gilbert no complaints  Objective:    Vitals:   12/31/21 2119 01/01/22 0129 01/01/22 0639 01/01/22 0945  BP: (!) 127/58 130/69 (!) 132/59 (!) 145/71  Pulse: 66 66 (!) 59 68  Resp: 16 18 17    Temp: 98 F (36.7 C) 97.9 F (36.6 C) 98 F (36.7 C) 97.7 F (36.5 C)  TempSrc: Oral Oral Oral   SpO2: 95% 96% 97% 99%  Weight:   60.4 kg    SpO2: 99 %   Intake/Output Summary (Last 24 hours) at 01/01/2022 1041 Last data filed at 01/01/2022 0600 Gross per 24 hour  Intake 1440 ml  Output 350 ml  Net 1090 ml    Filed Weights   01/01/22 0639  Weight: 60.4 kg    Exam: General exam: In no acute distress. Respiratory system: Good air movement and clear to auscultation. Cardiovascular system: S1 & S2 heard, RRR. No JVD. Gastrointestinal system: Abdomen is nondistended, soft and nontender.  Extremities: No pedal edema. Skin: No rashes, lesions or ulcers Psychiatry: No Judgement and insight appear normal.    Data Reviewed:    Labs: Basic Metabolic Panel: Recent Labs  Lab 12/28/21 2130 12/30/21 2121 12/31/21 0500 01/01/22 0435  NA 146* 143 144 144  K 3.7 4.6 3.8 3.7  CL 115* 112* 110 115*  CO2 24 26 25 22   GLUCOSE 116* 111* 107* 103*  BUN 23 28* 25* 25*  CREATININE 1.40* 1.95* 1.62* 1.55*  CALCIUM 8.8* 9.1 8.9 8.7*    GFR CrCl cannot be calculated (Unknown ideal weight.). Liver Function Tests: Recent Labs  Lab 12/28/21 2130 12/30/21 2121  AST 16 15  ALT 13 14  ALKPHOS 67 80  BILITOT 0.6 0.5  PROT 6.6 7.2  ALBUMIN 3.9 4.3    No results for input(s): LIPASE, AMYLASE in the last 168 hours. Recent Labs  Lab 12/31/21 0553  AMMONIA 25    Coagulation profile No results for input(s): INR, PROTIME in the last 168 hours. COVID-19 Labs  No results for input(s): DDIMER, FERRITIN, LDH, CRP in the last  72 hours.  Lab Results  Component Value Date   [place] NEGATIVE 12/30/2021    CBC: Recent Labs  Lab 12/28/21 2130 12/30/21 2121 12/31/21 0500 01/01/22 0435  WBC 6.2 6.6 7.0 4.7  NEUTROABS 3.2 2.3  --   --   HGB 14.4 14.3 14.5 13.1  HCT 41.9 42.6 42.5 38.6*  MCV 92.5 91.8 90.4 92.3  PLT 142* 142* 143* 136*    Cardiac Enzymes: No results for input(s): CKTOTAL, CKMB, CKMBINDEX, TROPONINI in the last 168 hours. BNP (last 3 results) No results for input(s): PROBNP in the last 8760 hours. CBG: No results for input(s): GLUCAP in the last 168 hours. D-Dimer: No results for input(s): DDIMER in the last 72 hours. Hgb A1c: No results for input(s): HGBA1C in the last 72 hours. Lipid Profile: No results for input(s): CHOL, HDL, LDLCALC, TRIG, CHOLHDL, LDLDIRECT in the last 72 hours. Thyroid function studies: Recent Labs    12/30/21 2121  TSH 0.982    Anemia work up:  Recent Labs    12/31/21 0500  VITAMINB12 751    Sepsis Labs: Recent Labs  Lab 12/28/21 2130 12/30/21 2121 12/31/21 0500 01/01/22 0435  WBC 6.2 6.6 7.0 4.7    Microbiology Recent Results (from the past 240 hour(s))  Resp Panel by RT-PCR (Flu A&B, Covid) Nasopharyngeal Swab     Status: None   Collection Time: 12/30/21  8:57 PM   Specimen: Nasopharyngeal Swab; Nasopharyngeal(NP) swabs in vial transport medium  Result Value Ref Range Status   SARS Coronavirus 2 by RT PCR NEGATIVE NEGATIVE Final    Comment: (NOTE) SARS-CoV-2 target nucleic acids are NOT DETECTED.  The SARS-CoV-2 RNA is generally detectable in upper respiratory specimens during the acute phase of infection. The lowest concentration of SARS-CoV-2 viral copies this assay can detect is 138 copies/mL. A negative result does not preclude SARS-Cov-2 infection and should not be used as the sole basis for treatment or other patient management decisions. A negative result may occur with  improper specimen collection/handling, submission  of specimen other than nasopharyngeal swab, presence of viral mutation(s) within the areas targeted by this assay, and inadequate number of viral copies(<138 copies/mL). A negative result must be combined with clinical observations, patient history, and epidemiological information. The expected result is Negative.  Fact Sheet for Patients:  EntrepreneurPulse.com.au  Fact Sheet for Healthcare Providers:  IncredibleEmployment.be  This test is no t yet approved or cleared by the [place] FDA and  has been authorized for detection and/or diagnosis of SARS-CoV-2 by FDA under an Emergency Use Authorization (EUA). This EUA will remain  in effect (meaning this test can be used) for the duration of the COVID-19 declaration under Section 564(b)(1) of the Act, 21 U.S.C.section 360bbb-3(b)(1), unless the authorization is terminated  or revoked sooner.       Influenza A by PCR NEGATIVE NEGATIVE Final   Influenza B by PCR NEGATIVE NEGATIVE Final    Comment: (NOTE) The Xpert Xpress SARS-CoV-2/FLU/RSV plus assay is intended as an aid in the diagnosis of influenza from Nasopharyngeal swab specimens and should not be used as a sole basis for treatment. Nasal washings and aspirates are unacceptable for Xpert Xpress SARS-CoV-2/FLU/RSV testing.  Fact Sheet for Patients: EntrepreneurPulse.com.au  Fact Sheet for Healthcare Providers: IncredibleEmployment.be  This test is not yet approved or cleared by the [place] FDA and has been authorized for detection and/or diagnosis of SARS-CoV-2 by FDA under an Emergency Use Authorization (EUA). This EUA will remain in effect (meaning this test can be used) for the duration of the COVID-19 declaration under Section 564(b)(1) of the Act, 21 U.S.C. section 360bbb-3(b)(1), unless the authorization is terminated or revoked.  Performed at [facility], [place]  [address] [place], [place] [address]      Medications:    aspirin EC  81 mg Oral Daily   carbidopa-levodopa  2 tablet Oral 3 times per day   feeding supplement  237 mL Oral BID BM   galantamine  8 mg Oral QODAY   heparin  5,000 Units Subcutaneous Q8H   melatonin  3 mg Oral QHS   multivitamin with minerals  1 tablet Oral Daily   pantoprazole  40 mg Oral Daily   simvastatin  40 mg Oral QHS   Continuous Infusions:      LOS: 1 day   Charlynne Cousins  [facility]  01/01/2022, 10:41 AM

## 2022-01-01 NOTE — Evaluation (Signed)
Physical Therapy Evaluation Patient Details Name: Jose Gilbert MRN: [id number] DOB: 03/21/1936 Today's Date: 01/01/2022  History of Present Illness  Jose Gilbert is a pleasant 88 y.o. male with medical history significant for CAD, Parkinson disease, hypertension, and depression, now presenting from his ALF for evaluation of somnolence and anorexia.  Pt dx with acute renal failur superimposed on CKD and acute metabolic encephalopathy.  Clinical Impression  Pt admitted as above and presenting with functional mobility limitations 2* generalized weakness, mild ambulatory balance deficits and dementia related cognitive deficits.  Pt should progress to dc back to prior ALF and would benefit from follow up PT to maximize IND and safety at facility.     Recommendations for follow up therapy are one component of a multi-disciplinary discharge planning process, led by the attending physician.  Recommendations may be updated based on patient status, additional functional criteria and insurance authorization.  Follow Up Recommendations Skilled nursing-short term rehab (<3 hours/day)    Assistance Recommended at Discharge Frequent or constant Supervision/Assistance  Patient can return home with the following  A little help with walking and/or transfers;A little help with bathing/dressing/bathroom;Assistance with cooking/housework;Assist for transportation;Help with stairs or ramp for entrance    Equipment Recommendations None recommended by PT  Recommendations for Other Services       Functional Status Assessment Patient has had a recent decline in their functional status and demonstrates the ability to make significant improvements in function in a reasonable and predictable amount of time.     Precautions / Restrictions Precautions Precautions: Fall Restrictions Weight Bearing Restrictions: No      Mobility  Bed Mobility Overal bed mobility: Needs Assistance Bed Mobility: Supine to Sit, Sit  to Supine     Supine to sit: Supervision, HOB elevated Sit to supine: Supervision   General bed mobility comments: INcreased time    Transfers Overall transfer level: Needs assistance Equipment used: Rolling walker (2 wheels) Transfers: Sit to/from Stand Sit to Stand: Min guard           General transfer comment: steady assist with cues for use of UEs    Ambulation/Gait Ambulation/Gait assistance: Min assist, Min guard Gait Distance (Feet): 280 Feet Assistive device: Rolling walker (2 wheels) Gait Pattern/deviations: Step-to pattern, Step-through pattern, Decreased step length - right, Decreased step length - left, Shuffle, Trunk flexed       General Gait Details: cues for posture, position from RW and safety awareness  Stairs            Wheelchair Mobility    Modified Rankin (Stroke Patients Only)       Balance Overall balance assessment: History of Falls, Mild deficits observed, not formally tested                                           Pertinent Vitals/Pain Pain Assessment Pain Assessment: No/denies pain    Home Living Family/patient expects to be discharged to:: [place]: Kasandra Knudsen - single point;Shower seat Additional Comments: Pt from [facility]    Prior Function Prior Level of Function : Needs assist  Cognitive Assist : ADLs (cognitive);Mobility (cognitive) Mobility (Cognitive): Intermittent cues ADLs (Cognitive): Set up cues Physical Assist : Mobility (physical) Mobility (physical): Gait   Mobility Comments: Per dtr, pt was ambulating with cane  but PT at facility was planning on transition to walker ADLs Comments: Dtr states, pt performed majority of tasks with occasional assist of roommate     Hand Dominance   Dominant Hand: Right    Extremity/Trunk Assessment   Upper Extremity Assessment Upper Extremity Assessment: Generalized weakness;Defer to OT  evaluation    Lower Extremity Assessment Lower Extremity Assessment: Generalized weakness       Communication   Communication: HOH  Cognition Arousal/Alertness: Awake/alert Behavior During Therapy: WFL for tasks assessed/performed, Flat affect Overall Cognitive Status: No family/caregiver present to determine baseline cognitive functioning Area of Impairment: Orientation, Memory, Following commands, Awareness, Safety/judgement                 Orientation Level: Disoriented to, Place, Time, Situation   Memory: Decreased short-term memory Following Commands: Follows one step commands consistently Safety/Judgement: Decreased awareness of deficits Awareness: Emergent            General Comments      Exercises     Assessment/Plan    PT Assessment Patient needs continued PT services  PT Problem List Decreased strength;Decreased activity tolerance;Decreased balance;Decreased cognition;Decreased knowledge of use of DME       PT Treatment Interventions DME instruction;Gait training;Functional mobility training;Therapeutic activities;Therapeutic exercise;Balance training;Patient/family education;Cognitive remediation    PT Goals (Current goals can be found in the Care Plan section)  Acute Rehab PT Goals Patient Stated Goal: Walk PT Goal Formulation: With patient Time For Goal Achievement: 01/13/22 Potential to Achieve Goals: Good    Frequency Min 3X/week     Co-evaluation               AM-PAC PT "6 Clicks" Mobility  Outcome Measure Help needed turning from your back to your side while in a flat bed without using bedrails?: None Help needed moving from lying on your back to sitting on the side of a flat bed without using bedrails?: None Help needed moving to and from a bed to a chair (including a wheelchair)?: A Little Help needed standing up from a chair using your arms (e.g., wheelchair or bedside chair)?: A Little Help needed to walk in hospital room?:  A Little Help needed climbing 3-5 steps with a railing? : A Little 6 Click Score: 20    End of Session Equipment Utilized During Treatment: Gait belt Activity Tolerance: Patient tolerated treatment well Patient left: in bed Nurse Communication: Mobility status PT Visit Diagnosis: Unsteadiness on feet (R26.81);Difficulty in walking, not elsewhere classified (R26.2)    Time: [id number] PT Time Calculation (min) (ACUTE ONLY): 16 min   Charges:   PT Evaluation $PT Eval Low Complexity: 1 Low          [place] Pager [phone] Office [phone]   Michol Emory 01/01/2022, 2:29 PM

## 2022-01-02 DIAGNOSIS — N1831 Chronic kidney disease, stage 3a: Secondary | ICD-10-CM | POA: Diagnosis not present

## 2022-01-02 DIAGNOSIS — N179 Acute kidney failure, unspecified: Secondary | ICD-10-CM | POA: Diagnosis not present

## 2022-01-02 MED ORDER — HALOPERIDOL LACTATE 5 MG/ML IJ SOLN: 1.0000 mg | Freq: Four times a day (QID) | INTRAMUSCULAR | Status: DC | PRN

## 2022-01-02 NOTE — TOC Progression Note (Signed)
Transition of Care [facility]) - Progression Note   Patient Details  Name: Jose Gilbert MRN: [id number] Date of Birth: 03/21/1936  [facility] CM/SW Contact  [name], LCSW Phone Number: 01/02/2022, 1:23 PM  Clinical Narrative: CSW left voicemail for [name] with [name] that patient will not be ready for discharge today.  Expected Discharge Plan: Assisted Living Barriers to Discharge: Continued Medical Work up  Expected Discharge Plan and Services Expected Discharge Plan: Assisted Living In-house Referral: Clinical Social Work Post Acute Care Choice: Home Health Living arrangements for the past 2 months: Assisted Living Facility            DME Arranged: N/A DME Agency: NA HH Arranged: PT, OT HH Agency: [facility] Date HH Agency Contacted: 01/01/22 Representative spoke with at [facility] Agency: [name]  Readmission Risk Interventions     View : No data to display.

## 2022-01-02 NOTE — Progress Notes (Signed)
Pt is very agitated at this time. Pt keeps trying to get out of bed and ask him where he wants to go and he said he wants to go up while pointing to the ceiling. PRN meds given ineffective. Soft wrist restraints applied. Will continue to monitor pt.

## 2022-01-02 NOTE — Progress Notes (Signed)
[facility] PROGRESS NOTE    Progress Note  Jose Gilbert  ZOX:[id number] DOB: 03/21/1936 DOA: 12/30/2021 [facility]     Brief Narrative:   Jose Gilbert is an 88 y.o. male past medical history significant for CAD, Parkinson's disease presents from ALF for evaluation of somnolence and anorexia, was seen in the ED 2 days prior to admission after a fall was evaluated and his mental status was at baseline so he was returned to ALF.  According to family has not been eating or drinking and appeared more sleepy daughter requested to be transferred back to the ED, CT of the head was negative for acute intracranial abnormalities or fractures creatinine was noted at 1.9, CBC showed mild thrombocytopenia   Assessment/Plan:   Acute renal failure superimposed on stage 3a chronic kidney disease (HCC) With a baseline creatinine around 1.21.4 on admission 1.9. Likely prerenal azotemia, due to decreased oral intake. Speech evaluated the patient recommended dysphagia 3 diet, is a level not recommended dysphagia 3 diet. We will have to try another regular diet with meat cut into small pieces  Acute metabolic encephalopathy: The admitting physician found to be the patient alert and oriented to person and place. CT of the head showed no acute findings. Possibly could be due to dehydration.   B12 700 TSH 0.9 . Required four-point restraints overnight.  Will need him 24 hours of four-point restraints.  Parkinson's disease (HCC) Continue current medication.  CAD (coronary artery disease) To aspirin and statins.     DVT prophylaxis: lovenox Family Communication:Daughter Status is: Observation The patient will require care spanning > 2 midnights and should be moved to inpatient because: AKI Probably home tomorrow morning.   Code Status:     Code Status Orders  (From admission, onward)           Start     Ordered   12/31/21 0406  Full code  Continuous         12/31/21 0407           Code Status History     This patient has a current code status but no historical code status.         IV Access:   Peripheral IV   Procedures and diagnostic studies:   No results found.   Medical Consultants:   None.   Subjective:    Jose Gilbert no complaints  Objective:    Vitals:   01/01/22 2117 01/02/22 0123 01/02/22 0500 01/02/22 0557  BP: (!) 143/68 (!) 160/76  (!) 171/86  Pulse: 69 68  72  Resp: 14   18  Temp: 98 F (36.7 C) 97.8 F (36.6 C)  97.9 F (36.6 C)  TempSrc: Oral Oral  Oral  SpO2: 99%   97%  Weight:   59.3 kg    SpO2: 97 %   Intake/Output Summary (Last 24 hours) at 01/02/2022 0959 Last data filed at 01/02/2022 0500 Gross per 24 hour  Intake 480 ml  Output 1425 ml  Net -945 ml    Filed Weights   01/01/22 0639 01/02/22 0500  Weight: 60.4 kg 59.3 kg    Exam: General exam: In no acute distress. Respiratory system: Good air movement and clear to auscultation. Cardiovascular system: S1 & S2 heard, RRR. No JVD. Gastrointestinal system: Abdomen is nondistended, soft and nontender.  Extremities: No pedal edema. Skin: No rashes, lesions or ulcers  Data Reviewed:    Labs: Basic Metabolic Panel: Recent Labs  Lab 12/28/21 2130  12/30/21 2121 12/31/21 0500 01/01/22 0435  NA 146* 143 144 144  K 3.7 4.6 3.8 3.7  CL 115* 112* 110 115*  CO2 24 26 25 22   GLUCOSE 116* 111* 107* 103*  BUN 23 28* 25* 25*  CREATININE 1.40* 1.95* 1.62* 1.55*  CALCIUM 8.8* 9.1 8.9 8.7*    GFR CrCl cannot be calculated (Unknown ideal weight.). Liver Function Tests: Recent Labs  Lab 12/28/21 2130 12/30/21 2121  AST 16 15  ALT 13 14  ALKPHOS 67 80  BILITOT 0.6 0.5  PROT 6.6 7.2  ALBUMIN 3.9 4.3    No results for input(s): LIPASE, AMYLASE in the last 168 hours. Recent Labs  Lab 12/31/21 0553  AMMONIA 25    Coagulation profile No results for input(s): INR, PROTIME in the last 168 hours. COVID-19  Labs  No results for input(s): DDIMER, FERRITIN, LDH, CRP in the last 72 hours.  Lab Results  Component Value Date   [place] NEGATIVE 12/30/2021    CBC: Recent Labs  Lab 12/28/21 2130 12/30/21 2121 12/31/21 0500 01/01/22 0435  WBC 6.2 6.6 7.0 4.7  NEUTROABS 3.2 2.3  --   --   HGB 14.4 14.3 14.5 13.1  HCT 41.9 42.6 42.5 38.6*  MCV 92.5 91.8 90.4 92.3  PLT 142* 142* 143* 136*    Cardiac Enzymes: No results for input(s): CKTOTAL, CKMB, CKMBINDEX, TROPONINI in the last 168 hours. BNP (last 3 results) No results for input(s): PROBNP in the last 8760 hours. CBG: No results for input(s): GLUCAP in the last 168 hours. D-Dimer: No results for input(s): DDIMER in the last 72 hours. Hgb A1c: No results for input(s): HGBA1C in the last 72 hours. Lipid Profile: No results for input(s): CHOL, HDL, LDLCALC, TRIG, CHOLHDL, LDLDIRECT in the last 72 hours. Thyroid function studies: Recent Labs    12/30/21 2121  TSH 0.982    Anemia work up: Recent Labs    12/31/21 0500  VITAMINB12 751    Sepsis Labs: Recent Labs  Lab 12/28/21 2130 12/30/21 2121 12/31/21 0500 01/01/22 0435  WBC 6.2 6.6 7.0 4.7    Microbiology Recent Results (from the past 240 hour(s))  Resp Panel by RT-PCR (Flu A&B, Covid) Nasopharyngeal Swab     Status: None   Collection Time: 12/30/21  8:57 PM   Specimen: Nasopharyngeal Swab; Nasopharyngeal(NP) swabs in vial transport medium  Result Value Ref Range Status   SARS Coronavirus 2 by RT PCR NEGATIVE NEGATIVE Final    Comment: (NOTE) SARS-CoV-2 target nucleic acids are NOT DETECTED.  The SARS-CoV-2 RNA is generally detectable in upper respiratory specimens during the acute phase of infection. The lowest concentration of SARS-CoV-2 viral copies this assay can detect is 138 copies/mL. A negative result does not preclude SARS-Cov-2 infection and should not be used as the sole basis for treatment or other patient management decisions. A negative  result may occur with  improper specimen collection/handling, submission of specimen other than nasopharyngeal swab, presence of viral mutation(s) within the areas targeted by this assay, and inadequate number of viral copies(<138 copies/mL). A negative result must be combined with clinical observations, patient history, and epidemiological information. The expected result is Negative.  Fact Sheet for Patients:  EntrepreneurPulse.com.au  Fact Sheet for Healthcare Providers:  IncredibleEmployment.be  This test is no t yet approved or cleared by the [place] FDA and  has been authorized for detection and/or diagnosis of SARS-CoV-2 by FDA under an Emergency Use Authorization (EUA). This EUA will remain  in effect (  meaning this test can be used) for the duration of the COVID-19 declaration under Section 564(b)(1) of the Act, 21 U.S.C.section 360bbb-3(b)(1), unless the authorization is terminated  or revoked sooner.       Influenza A by PCR NEGATIVE NEGATIVE Final   Influenza B by PCR NEGATIVE NEGATIVE Final    Comment: (NOTE) The Xpert Xpress SARS-CoV-2/FLU/RSV plus assay is intended as an aid in the diagnosis of influenza from Nasopharyngeal swab specimens and should not be used as a sole basis for treatment. Nasal washings and aspirates are unacceptable for Xpert Xpress SARS-CoV-2/FLU/RSV testing.  Fact Sheet for Patients: EntrepreneurPulse.com.au  Fact Sheet for Healthcare Providers: IncredibleEmployment.be  This test is not yet approved or cleared by the [place] FDA and has been authorized for detection and/or diagnosis of SARS-CoV-2 by FDA under an Emergency Use Authorization (EUA). This EUA will remain in effect (meaning this test can be used) for the duration of the COVID-19 declaration under Section 564(b)(1) of the Act, 21 U.S.C. section 360bbb-3(b)(1), unless the authorization is  terminated or revoked.  Performed at [facility], [place] [address] [place], [place] [address]      Medications:    aspirin EC  81 mg Oral Daily   carbidopa-levodopa  2 tablet Oral 3 times per day   feeding supplement  237 mL Oral BID BM   galantamine  8 mg Oral QODAY   heparin  5,000 Units Subcutaneous Q8H   melatonin  3 mg Oral QHS   multivitamin with minerals  1 tablet Oral Daily   pantoprazole  40 mg Oral Daily   simvastatin  40 mg Oral QHS   Continuous Infusions:      LOS: 2 days   Charlynne Cousins  [facility]  01/02/2022, 9:59 AM

## 2022-01-03 DIAGNOSIS — E860 Dehydration: Secondary | ICD-10-CM

## 2022-01-03 DIAGNOSIS — N179 Acute kidney failure, unspecified: Secondary | ICD-10-CM | POA: Diagnosis not present

## 2022-01-03 DIAGNOSIS — G9340 Encephalopathy, unspecified: Secondary | ICD-10-CM | POA: Diagnosis not present

## 2022-01-03 DIAGNOSIS — N1831 Chronic kidney disease, stage 3a: Secondary | ICD-10-CM | POA: Diagnosis not present

## 2022-01-03 MED ORDER — DIPHENHYDRAMINE HCL 25 MG PO CAPS
25.0000 mg | ORAL_CAPSULE | Freq: Once | ORAL | Status: AC
Start: 2022-01-03 — End: 2022-01-03
  Administered 2022-01-03: 25 mg via ORAL
  Filled 2022-01-03: qty 1

## 2022-01-03 NOTE — TOC Transition Note (Addendum)
Transition of Care [facility]) - CM/SW Discharge Note   Patient Details  Name: Jose Gilbert MRN: [id number] Date of Birth: 03/21/1936  [facility] CM/SW Contact:  Larrie Kass, LCSW Phone Number: 01/03/2022, 9:14 AM   Clinical Narrative:     CSW Spoke with [name] with [facility] and provided an update on pt's diet. [name] stated pt can now return to the facility. She reported calling report to the main number [phone].  CSW spoke with [name] to inform her, the pt will be returning back to [facility] today. Pt's daughter reported it was mentioned to her that the pt may need rehab. Pt's daughter stated she did not think pt needed SNF as pt was able to ambulate well yesterday. Pt's daughter stated pt will be receiving Physical Therapy when back at [facility] and declined SNF placement. Pt's daughter stated she will be here to pick pt up around 11 am for d/c.   Adden 9:49am Pt's RN informed CSW pt's restraints were removed at 8 am this morning. Pt must not have restraints for 24 hrs prior to d/c to facility.       Barriers to Discharge: Continued Medical Work up   Patient Goals and CMS Choice Patient states their goals for this hospitalization and ongoing recovery are:: Return to [name] CMS Medicare.gov Compare Post Acute Care list provided to:: Patient Represenative (must comment) Choice offered to / list presented to : Adult Children  Discharge Placement                       Discharge Plan and Services In-house Referral: Clinical Social Work   Post Acute Care Choice: Home Health          DME Arranged: N/A DME Agency: NA       HH Arranged: PT, OT HH Agency: [facility] Date HH Agency Contacted: 01/01/22   Representative spoke with at [facility] Agency: [name]  Social Determinants of Health (SDOH) Interventions     Readmission Risk Interventions     View : No data to display.

## 2022-01-03 NOTE — Discharge Summary (Signed)
Physician Discharge Summary  Clent Lafon [id number] DOB: 03/21/1936 DOA: 12/30/2021  PCP: [facility]  Admit date: 12/30/2021 Discharge date: 01/03/2022  Admitted From: ALF Disposition:  ALF  Recommendations for Outpatient Follow-up:  Follow up with PCP in 1-2 weeks Please obtain BMP/CBC in one week   Home Health:No Equipment/Devices:None  Discharge Condition:Stable CODE STATUS:Full Diet recommendation: Heart Healthy   Brief/Interim Summary: 88 y.o. male past medical history significant for CAD, Parkinson's disease presents from ALF for evaluation of somnolence and anorexia, was seen in the ED 2 days prior to admission after a fall was evaluated and his mental status was at baseline so he was returned to ALF.  According to family has not been eating or drinking and appeared more sleepy daughter requested to be transferred back to the ED, CT of the head was negative for acute intracranial abnormalities or fractures creatinine was noted at 1.9, CBC showed mild thrombocytopenia  Discharge Diagnoses:  Principal Problem:   Acute renal failure superimposed on stage 3a chronic kidney disease ([place]) Active Problems:   Parkinson's disease ([place])   Acute encephalopathy   CAD (coronary artery disease)   AKI (acute kidney injury) ([place])   Protein-calorie malnutrition, severe  Acute kidney injury superimposed on chronic kidney disease stage III: With a baseline creatinine 1.2-1.4 at admission 1.9 he was started on fluid resuscitation and his creatinine returned to baseline. Agree.  No azotemia in the setting of decreased oral intake. Speech evaluated the patient and recommended a dysphagia 3 diet. He is ALF cannot do a dysphagia 3 diet so he was tried on a regular diet with cut up pieces of meat which he tolerated overnight. Physical therapy and Occupational Therapy evaluated the patient, he will have to go to rehab temporarily.  Acute metabolic encephalopathy: Limited  physician found the patient awake alert and oriented to time and place and person. CT of the head showed no acute findings. Possibly due to dehydration because after fluid hydration his mentation had improved. B12 was 700 TSH was 0.9.   Parkinson disease: Continue current medication.  CAD: Continue aspirin and statins.  Discharge Instructions  Discharge Instructions     Diet - low sodium heart healthy   Complete by: As directed    Increase activity slowly   Complete by: As directed       Allergies as of 01/03/2022       Reactions   Benadryl [diphenhydramine] Other (See Comments)   UNK reaction ( not on MAR)   Imdur [isosorbide Nitrate] Other (See Comments)   UNK reaction   Iodinated Contrast Media Other (See Comments)   UNK reaction   Mirtazapine Other (See Comments)   UNK reaction   Penicillins Other (See Comments)   UNK reaction   Shellfish Allergy Other (See Comments)   Unk reaction   Tramadol Other (See Comments)   UNK reaction        Medication List     STOP taking these medications    vitamin B-12 500 MCG tablet Commonly known as: CYANOCOBALAMIN       TAKE these medications    acetaminophen 325 MG tablet Commonly known as: TYLENOL Take 650 mg by mouth every 8 (eight) hours as needed for mild pain.   aspirin EC 81 MG tablet Take 81 mg by mouth daily.   Belsomra 5 MG Tabs Generic drug: Suvorexant Take 5 mg by mouth daily as needed (as needed for sleep).   Belsomra 5 MG Tabs Generic drug: Suvorexant Take 1  tablet by mouth at bedtime.   carbidopa-levodopa 25-100 MG tablet Commonly known as: SINEMET IR Take 2 tablets by mouth in the morning, at noon, and at bedtime. 0900,1700,2100   Cranberry 200 MG Caps Take 200 mg by mouth daily.   docusate sodium 100 MG capsule Commonly known as: COLACE Take 100 mg by mouth daily.   galantamine 8 MG 24 hr capsule Commonly known as: RAZADYNE ER Take 8 mg by mouth every other day. At bedtime    nitroGLYCERIN 0.4 MG SL tablet Commonly known as: NITROSTAT Place 0.4 mg under the tongue as directed.   omeprazole 20 MG capsule Commonly known as: PRILOSEC Take 20 mg by mouth daily.   simvastatin 80 MG tablet Commonly known as: ZOCOR Take 40 mg by mouth at bedtime.   venlafaxine XR 75 MG 24 hr capsule Commonly known as: EFFEXOR-XR Take 225 mg by mouth at bedtime.   Vitamin D 50 MCG (2000 UT) tablet Take 2,000 Units by mouth daily.        Allergies  Allergen Reactions   Benadryl [Diphenhydramine] Other (See Comments)    UNK reaction ( not on MAR)   Imdur [Isosorbide Nitrate] Other (See Comments)    UNK reaction   Iodinated Contrast Media Other (See Comments)    UNK reaction   Mirtazapine Other (See Comments)    UNK reaction   Penicillins Other (See Comments)    UNK reaction   Shellfish Allergy Other (See Comments)    Unk reaction   Tramadol Other (See Comments)    UNK reaction    Consultations: None   Procedures/Studies: CT Head Wo Contrast  Result Date: 12/30/2021 CLINICAL DATA:  Fall injury.  Delirium.  Altered mental status. EXAM: CT HEAD WITHOUT CONTRAST TECHNIQUE: Contiguous axial images were obtained from the base of the skull through the vertex without intravenous contrast. RADIATION DOSE REDUCTION: This exam was performed according to the departmental dose-optimization program which includes automated exposure control, adjustment of the mA and/or kV according to patient size and/or use of iterative reconstruction technique. COMPARISON:  Head CT 12/28/2021. FINDINGS: Brain: There is moderate cerebral atrophy, small vessel disease and atrophic ventriculomegaly, without midline shift and with senescent calcification in the basal ganglia. Cerebellum and brainstem are unremarkable, as visualized. No asymmetry is seen worrisome for acute infarct, hemorrhage or mass. 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 There is no  mass effect. Vascular: There calcifications of the carotid siphons and distal vertebral arteries but no hyperdense central vasculature. Skull: Intact calvarium, skull base and orbits. No focal bone lesion. No visible scalp hematoma. Sinuses/Orbits: No acute abnormality. No appreciable mastoid effusion. Old lens replacements. Other: None. IMPRESSION: Chronic changes. No acute intracranial CT findings, depressed skull fractures or interval changes. Electronically Signed   By: Telford Nab M.D.   On: 12/30/2021 21:15   CT Head Wo Contrast  Result Date: 12/28/2021 CLINICAL DATA:  Trauma, fall EXAM: CT HEAD WITHOUT CONTRAST TECHNIQUE: Contiguous axial images were obtained from the base of the skull through the vertex without intravenous contrast. RADIATION DOSE REDUCTION: This exam was performed according to the departmental dose-optimization program which includes automated exposure control, adjustment of the mA and/or kV according to patient size and/or use of iterative reconstruction technique. COMPARISON:  None Available. FINDINGS: Brain: No acute intracranial findings are seen in noncontrast CT brain. There are no signs of bleeding. Cortical sulci are prominent. There is decreased density in the periventricular white matter. Small calcifications are seen  in the basal ganglia. Vascular: Scattered arterial calcifications are seen. Skull: No fracture is seen in the calvarium. Sinuses/Orbits: Paranasal sinuses are unremarkable. Postsurgical changes are noted in both optic globes. Other: None IMPRESSION: No acute intracranial findings are seen in noncontrast CT brain. Atrophy. Small-vessel disease. Electronically Signed   By: Elmer Picker M.D.   On: 12/28/2021 18:53   DG Chest Portable 1 View  Result Date: 12/30/2021 CLINICAL DATA:  Altered mental status EXAM: PORTABLE CHEST 1 VIEW COMPARISON:  None Available. FINDINGS: Lungs are clear.  No pleural effusion or pneumothorax. The heart is normal in size.  Median sternotomy. IMPRESSION: No evidence of acute cardiopulmonary disease. Electronically Signed   By: Julian Hy M.D.   On: 12/30/2021 21:18   (Echo, Carotid, EGD, Colonoscopy, ERCP)    Subjective: No complaints  Discharge Exam: Vitals:   01/02/22 2040 01/03/22 0535  BP: (!) 144/73 (!) 142/105  Pulse: 70 67  Resp: 18 18  Temp: 97.9 F (36.6 C) 98 F (36.7 C)  SpO2: 92% 96%   Vitals:   01/02/22 1249 01/02/22 2040 01/03/22 0500 01/03/22 0535  BP: 130/70 (!) 144/73  (!) 142/105  Pulse: 69 70  67  Resp: 17 18  18   Temp: 98 F (36.7 C) 97.9 F (36.6 C)  98 F (36.7 C)  TempSrc: Oral Oral  Oral  SpO2: 98% 92%  96%  Weight:   63.4 kg     General: Pt is alert, awake, not in acute distress Cardiovascular: RRR, S1/S2 +, no rubs, no gallops Respiratory: CTA bilaterally, no wheezing, no rhonchi Abdominal: Soft, NT, ND, bowel sounds + Extremities: no edema, no cyanosis    The results of significant diagnostics from this hospitalization (including imaging, microbiology, ancillary and laboratory) are listed below for reference.     Microbiology: Recent Results (from the past 240 hour(s))  Resp Panel by RT-PCR (Flu A&B, Covid) Nasopharyngeal Swab     Status: None   Collection Time: 12/30/21  8:57 PM   Specimen: Nasopharyngeal Swab; Nasopharyngeal(NP) swabs in vial transport medium  Result Value Ref Range Status   SARS Coronavirus 2 by RT PCR NEGATIVE NEGATIVE Final    Comment: (NOTE) SARS-CoV-2 target nucleic acids are NOT DETECTED.  The SARS-CoV-2 RNA is generally detectable in upper respiratory specimens during the acute phase of infection. The lowest concentration of SARS-CoV-2 viral copies this assay can detect is 138 copies/mL. A negative result does not preclude SARS-Cov-2 infection and should not be used as the sole basis for treatment or other patient management decisions. A negative result may occur with  improper specimen collection/handling, submission  of specimen other than nasopharyngeal swab, presence of viral mutation(s) within the areas targeted by this assay, and inadequate number of viral copies(<138 copies/mL). A negative result must be combined with clinical observations, patient history, and epidemiological information. The expected result is Negative.  Fact Sheet for Patients:  EntrepreneurPulse.com.au  Fact Sheet for Healthcare Providers:  IncredibleEmployment.be  This test is no t yet approved or cleared by the [place] FDA and  has been authorized for detection and/or diagnosis of SARS-CoV-2 by FDA under an Emergency Use Authorization (EUA). This EUA will remain  in effect (meaning this test can be used) for the duration of the COVID-19 declaration under Section 564(b)(1) of the Act, 21 U.S.C.section 360bbb-3(b)(1), unless the authorization is terminated  or revoked sooner.       Influenza A by PCR NEGATIVE NEGATIVE Final   Influenza B by PCR NEGATIVE NEGATIVE  Final    Comment: (NOTE) The Xpert Xpress SARS-CoV-2/FLU/RSV plus assay is intended as an aid in the diagnosis of influenza from Nasopharyngeal swab specimens and should not be used as a sole basis for treatment. Nasal washings and aspirates are unacceptable for Xpert Xpress SARS-CoV-2/FLU/RSV testing.  Fact Sheet for Patients: EntrepreneurPulse.com.au  Fact Sheet for Healthcare Providers: IncredibleEmployment.be  This test is not yet approved or cleared by the [place] FDA and has been authorized for detection and/or diagnosis of SARS-CoV-2 by FDA under an Emergency Use Authorization (EUA). This EUA will remain in effect (meaning this test can be used) for the duration of the COVID-19 declaration under Section 564(b)(1) of the Act, 21 U.S.C. section 360bbb-3(b)(1), unless the authorization is terminated or revoked.  Performed at [facility], [place]  [address], [place], [place] [address]      Labs: BNP (last 3 results) No results for input(s): BNP in the last 8760 hours. Basic Metabolic Panel: Recent Labs  Lab 12/28/21 2130 12/30/21 2121 12/31/21 0500 01/01/22 0435  NA 146* 143 144 144  K 3.7 4.6 3.8 3.7  CL 115* 112* 110 115*  CO2 24 26 25 22   GLUCOSE 116* 111* 107* 103*  BUN 23 28* 25* 25*  CREATININE 1.40* 1.95* 1.62* 1.55*  CALCIUM 8.8* 9.1 8.9 8.7*   Liver Function Tests: Recent Labs  Lab 12/28/21 2130 12/30/21 2121  AST 16 15  ALT 13 14  ALKPHOS 67 80  BILITOT 0.6 0.5  PROT 6.6 7.2  ALBUMIN 3.9 4.3   No results for input(s): LIPASE, AMYLASE in the last 168 hours. Recent Labs  Lab 12/31/21 0553  AMMONIA 25   CBC: Recent Labs  Lab 12/28/21 2130 12/30/21 2121 12/31/21 0500 01/01/22 0435  WBC 6.2 6.6 7.0 4.7  NEUTROABS 3.2 2.3  --   --   HGB 14.4 14.3 14.5 13.1  HCT 41.9 42.6 42.5 38.6*  MCV 92.5 91.8 90.4 92.3  PLT 142* 142* 143* 136*   Cardiac Enzymes: No results for input(s): CKTOTAL, CKMB, CKMBINDEX, TROPONINI in the last 168 hours. BNP: Invalid input(s): POCBNP CBG: No results for input(s): GLUCAP in the last 168 hours. D-Dimer No results for input(s): DDIMER in the last 72 hours. Hgb A1c No results for input(s): HGBA1C in the last 72 hours. Lipid Profile No results for input(s): CHOL, HDL, LDLCALC, TRIG, CHOLHDL, LDLDIRECT in the last 72 hours. Thyroid function studies No results for input(s): TSH, T4TOTAL, T3FREE, THYROIDAB in the last 72 hours.  Invalid input(s): FREET3 Anemia work up No results for input(s): VITAMINB12, FOLATE, FERRITIN, TIBC, IRON, RETICCTPCT in the last 72 hours. Urinalysis    Component Value Date/Time   COLORURINE STRAW (A) 12/30/2021 2054   APPEARANCEUR CLEAR 12/30/2021 2054   LABSPEC 1.009 12/30/2021 2054   PHURINE 6.0 12/30/2021 2054   GLUCOSEU NEGATIVE 12/30/2021 2054   HGBUR NEGATIVE 12/30/2021 2054   [place] NEGATIVE 12/30/2021 2054    [place] NEGATIVE 12/30/2021 2054   PROTEINUR NEGATIVE 12/30/2021 2054   NITRITE NEGATIVE 12/30/2021 2054   LEUKOCYTESUR NEGATIVE 12/30/2021 2054   Sepsis Labs Invalid input(s): PROCALCITONIN,  WBC,  LACTICIDVEN Microbiology Recent Results (from the past 240 hour(s))  Resp Panel by RT-PCR (Flu A&B, Covid) Nasopharyngeal Swab     Status: None   Collection Time: 12/30/21  8:57 PM   Specimen: Nasopharyngeal Swab; Nasopharyngeal(NP) swabs in vial transport medium  Result Value Ref Range Status   SARS Coronavirus 2 by RT PCR NEGATIVE NEGATIVE Final  Comment: (NOTE) SARS-CoV-2 target nucleic acids are NOT DETECTED.  The SARS-CoV-2 RNA is generally detectable in upper respiratory specimens during the acute phase of infection. The lowest concentration of SARS-CoV-2 viral copies this assay can detect is 138 copies/mL. A negative result does not preclude SARS-Cov-2 infection and should not be used as the sole basis for treatment or other patient management decisions. A negative result may occur with  improper specimen collection/handling, submission of specimen other than nasopharyngeal swab, presence of viral mutation(s) within the areas targeted by this assay, and inadequate number of viral copies(<138 copies/mL). A negative result must be combined with clinical observations, patient history, and epidemiological information. The expected result is Negative.  Fact Sheet for Patients:  EntrepreneurPulse.com.au  Fact Sheet for Healthcare Providers:  IncredibleEmployment.be  This test is no t yet approved or cleared by the [place] FDA and  has been authorized for detection and/or diagnosis of SARS-CoV-2 by FDA under an Emergency Use Authorization (EUA). This EUA will remain  in effect (meaning this test can be used) for the duration of the COVID-19 declaration under Section 564(b)(1) of the Act, 21 U.S.C.section 360bbb-3(b)(1), unless the  authorization is terminated  or revoked sooner.       Influenza A by PCR NEGATIVE NEGATIVE Final   Influenza B by PCR NEGATIVE NEGATIVE Final    Comment: (NOTE) The Xpert Xpress SARS-CoV-2/FLU/RSV plus assay is intended as an aid in the diagnosis of influenza from Nasopharyngeal swab specimens and should not be used as a sole basis for treatment. Nasal washings and aspirates are unacceptable for Xpert Xpress SARS-CoV-2/FLU/RSV testing.  Fact Sheet for Patients: EntrepreneurPulse.com.au  Fact Sheet for Healthcare Providers: IncredibleEmployment.be  This test is not yet approved or cleared by the [place] FDA and has been authorized for detection and/or diagnosis of SARS-CoV-2 by FDA under an Emergency Use Authorization (EUA). This EUA will remain in effect (meaning this test can be used) for the duration of the COVID-19 declaration under Section 564(b)(1) of the Act, 21 U.S.C. section 360bbb-3(b)(1), unless the authorization is terminated or revoked.  Performed at [facility], [place] [address] [place], [place] [address]      SIGNED:   Charlynne Cousins, MD  [facility] 01/03/2022, 7:17 AM Pager   If 7PM-7AM, please contact night-coverage [email or web address] Password [email or web address]

## 2022-01-03 NOTE — Progress Notes (Signed)
Patient's Dc ordered has been cancel due to restraints.Patient should not be on restraints for 24 hrs prior to d/c to facility. Patient's restraints removed this morning at 8 am. Notified MD, CSW, and patient's daughter Rinaldo Cloud.

## 2022-01-04 ENCOUNTER — Encounter (HOSPITAL_COMMUNITY): Payer: Self-pay | Admitting: Internal Medicine

## 2022-01-04 DIAGNOSIS — E860 Dehydration: Secondary | ICD-10-CM | POA: Diagnosis not present

## 2022-01-04 DIAGNOSIS — G9340 Encephalopathy, unspecified: Secondary | ICD-10-CM | POA: Diagnosis not present

## 2022-01-04 DIAGNOSIS — N1831 Chronic kidney disease, stage 3a: Secondary | ICD-10-CM | POA: Diagnosis not present

## 2022-01-04 DIAGNOSIS — N179 Acute kidney failure, unspecified: Secondary | ICD-10-CM | POA: Diagnosis not present

## 2022-01-04 NOTE — Progress Notes (Signed)
Physical Therapy Treatment Patient Details Name: Jose Gilbert MRN: [id number] DOB: 03/21/1936 Today's Date: 01/04/2022   History of Present Illness Jose Gilbert is a pleasant 88 y.o. male with medical history significant for CAD, Parkinson disease, hypertension, and depression, now presenting from his ALF for evaluation of somnolence and anorexia.  Pt dx with acute renal failur superimposed on CKD and acute metabolic encephalopathy.    PT Comments    Pt alert but disoriented x4: reporting accurately his name but says his birthday is [birth year], it is December, he is in "your building" and cannot state why he is here. Able to follow one-step commands consistently. Ambulated ~174ft with RW and min guard to min assist for steadying of RW, with constant multimodal cuing for proximity to device. Pt completed short set of BLE exercises with good form and minimal cuing. Recommended pt ambulate with RW upon discharge and pt agreeable; however, given level of orientation this will require constant cuing. Discharge destination remains appropriate, we will continue to follow to progress mobility as able during acute stay.   Recommendations for follow up therapy are one component of a multi-disciplinary discharge planning process, led by the attending physician.  Recommendations may be updated based on patient status, additional functional criteria and insurance authorization.  Follow Up Recommendations  Skilled nursing-short term rehab (<3 hours/day)     Assistance Recommended at Discharge Frequent or constant Supervision/Assistance  Patient can return home with the following A little help with walking and/or transfers;A little help with bathing/dressing/bathroom;Assistance with cooking/housework;Assist for transportation;Help with stairs or ramp for entrance   Equipment Recommendations  None recommended by PT    Recommendations for Other Services       Precautions / Restrictions  Precautions Precautions: Fall Restrictions Weight Bearing Restrictions: No     Mobility  Bed Mobility Overal bed mobility: Needs Assistance Bed Mobility: Supine to Sit     Supine to sit: Supervision, HOB elevated Sit to supine: Supervision   General bed mobility comments: INcreased time    Transfers Overall transfer level: Needs assistance Equipment used: Rolling walker (2 wheels) Transfers: Sit to/from Stand Sit to Stand: Min guard           General transfer comment: steady assist with cues for use of UEs    Ambulation/Gait Ambulation/Gait assistance: Min assist, Min guard Gait Distance (Feet): 120 Feet Assistive device: Rolling walker (2 wheels) Gait Pattern/deviations: Step-to pattern, Step-through pattern, Decreased step length - right, Decreased step length - left, Shuffle, Trunk flexed Gait velocity: decreased     General Gait Details: Pt ambulated 17ft with RW and min guard to min assist for steadying, no overt LOB noted. Cues for posture and proximity to AD.   Stairs             Wheelchair Mobility    Modified Rankin (Stroke Patients Only)       Balance Overall balance assessment: History of Falls, Needs assistance Sitting-balance support: Feet supported, No upper extremity supported Sitting balance-Leahy Scale: Fair     Standing balance support: Bilateral upper extremity supported, During functional activity Standing balance-Leahy Scale: Fair                              Cognition Arousal/Alertness: Awake/alert Behavior During Therapy: WFL for tasks assessed/performed, Flat affect Overall Cognitive Status: No family/caregiver present to determine baseline cognitive functioning Area of Impairment: Orientation, Memory, Following commands, Awareness, Safety/judgement  Orientation Level: Place, Time, Situation, Person, Disoriented to (Pt reports his birthday is [birth year], it is December, he is in "my  building" and doesn't know why he is here.)   Memory: Decreased short-term memory Following Commands: Follows one step commands consistently Safety/Judgement: Decreased awareness of deficits Awareness: Emergent   General Comments: Pt pleasant, agreeable, and follows 1-step commands consistently but is not oriented at all.        Exercises General Exercises - Lower Extremity Long Arc Quad: AROM, Both, 10 reps, Seated Hip Flexion/Marching: AROM, Both, 10 reps, Seated    General Comments        Pertinent Vitals/Pain      Home Living                          Prior Function            PT Goals (current goals can now be found in the care plan section) Acute Rehab PT Goals Patient Stated Goal: Walk PT Goal Formulation: With patient Time For Goal Achievement: 01/13/22 Potential to Achieve Goals: Good Progress towards PT goals: Progressing toward goals    Frequency    Min 3X/week      PT Plan Current plan remains appropriate    Co-evaluation              AM-PAC PT "6 Clicks" Mobility   Outcome Measure  Help needed turning from your back to your side while in a flat bed without using bedrails?: None Help needed moving from lying on your back to sitting on the side of a flat bed without using bedrails?: None Help needed moving to and from a bed to a chair (including a wheelchair)?: A Little Help needed standing up from a chair using your arms (e.g., wheelchair or bedside chair)?: A Little Help needed to walk in hospital room?: A Little Help needed climbing 3-5 steps with a railing? : A Lot 6 Click Score: 19    End of Session Equipment Utilized During Treatment: Gait belt Activity Tolerance: Patient tolerated treatment well Patient left: in chair;with call bell/phone within reach;with chair alarm set Nurse Communication: Mobility status PT Visit Diagnosis: Unsteadiness on feet (R26.81);Difficulty in walking, not elsewhere classified (R26.2)      Time: 1751-0258 PT Time Calculation (min) (ACUTE ONLY): 17 min  Charges:  $Gait Training: 8-22 mins                     Jamesetta Geralds, PT, DPT [facility] Department Office: [phone] Pager: [phone]   Jamesetta Geralds 01/04/2022, 10:11 AM

## 2022-01-04 NOTE — Discharge Summary (Signed)
Physician Discharge Summary  Jose Gilbert [id number] DOB: 03/21/1936 DOA: 12/30/2021  PCP: [facility]  Admit date: 12/30/2021 Discharge date: 01/04/2022  Admitted From: ALF Disposition:  ALF  Recommendations for Outpatient Follow-up:  Follow up with PCP in 1-2 weeks Please obtain BMP/CBC in one week   Home Health:No Equipment/Devices:None  Discharge Condition:Stable CODE STATUS:Full Diet recommendation: Heart Healthy   Brief/Interim Summary: 88 y.o. male past medical history significant for CAD, Parkinson's disease presents from ALF for evaluation of somnolence and anorexia, was seen in the ED 2 days prior to admission after a fall was evaluated and his mental status was at baseline so he was returned to ALF.  According to family has not been eating or drinking and appeared more sleepy daughter requested to be transferred back to the ED, CT of the head was negative for acute intracranial abnormalities or fractures creatinine was noted at 1.9, CBC showed mild thrombocytopenia  Discharge Diagnoses:  Principal Problem:   Acute renal failure superimposed on stage 3a chronic kidney disease ([place]) Active Problems:   Parkinson's disease ([place])   Acute encephalopathy   CAD (coronary artery disease)   AKI (acute kidney injury) ([place])   Protein-calorie malnutrition, severe  Acute kidney injury superimposed on chronic kidney disease stage III: With a baseline creatinine 1.2-1.4 at admission 1.9 he was started on fluid resuscitation and his creatinine returned to baseline. Agree.  No azotemia in the setting of decreased oral intake. Speech evaluated the patient and recommended a dysphagia 3 diet. He is ALF cannot do a dysphagia 3 diet so he was tried on a regular diet with cut up pieces of meat which he tolerated overnight. Physical therapy and Occupational Therapy evaluated the patient, he will have to go to rehab temporarily. Patient had no events overnight, had to stay an  extra day due to bed availability.  Acute metabolic encephalopathy: Limited physician found the patient awake alert and oriented to time and place and person. CT of the head showed no acute findings. Possibly due to dehydration because after fluid hydration his mentation had improved. B12 was 700 TSH was 0.9.   Parkinson disease: Continue current medication.  CAD: Continue aspirin and statins.  Discharge Instructions  Discharge Instructions     Diet - low sodium heart healthy   Complete by: As directed    Increase activity slowly   Complete by: As directed       Allergies as of 01/04/2022       Reactions   Benadryl [diphenhydramine] Other (See Comments)   UNK reaction ( not on MAR)   Imdur [isosorbide Nitrate] Other (See Comments)   UNK reaction   Iodinated Contrast Media Other (See Comments)   UNK reaction   Mirtazapine Other (See Comments)   UNK reaction   Penicillins Other (See Comments)   UNK reaction   Shellfish Allergy Other (See Comments)   Unk reaction   Tramadol Other (See Comments)   UNK reaction        Medication List     STOP taking these medications    vitamin B-12 500 MCG tablet Commonly known as: CYANOCOBALAMIN       TAKE these medications    acetaminophen 325 MG tablet Commonly known as: TYLENOL Take 650 mg by mouth every 8 (eight) hours as needed for mild pain.   aspirin EC 81 MG tablet Take 81 mg by mouth daily.   Belsomra 5 MG Tabs Generic drug: Suvorexant Take 5 mg by mouth daily as needed (  as needed for sleep).   Belsomra 5 MG Tabs Generic drug: Suvorexant Take 1 tablet by mouth at bedtime.   carbidopa-levodopa 25-100 MG tablet Commonly known as: SINEMET IR Take 2 tablets by mouth in the morning, at noon, and at bedtime. 0900,1700,2100   Cranberry 200 MG Caps Take 200 mg by mouth daily.   docusate sodium 100 MG capsule Commonly known as: COLACE Take 100 mg by mouth daily.   galantamine 8 MG 24 hr  capsule Commonly known as: RAZADYNE ER Take 8 mg by mouth every other day. At bedtime   nitroGLYCERIN 0.4 MG SL tablet Commonly known as: NITROSTAT Place 0.4 mg under the tongue as directed.   omeprazole 20 MG capsule Commonly known as: PRILOSEC Take 20 mg by mouth daily.   simvastatin 80 MG tablet Commonly known as: ZOCOR Take 40 mg by mouth at bedtime.   venlafaxine XR 75 MG 24 hr capsule Commonly known as: EFFEXOR-XR Take 225 mg by mouth at bedtime.   Vitamin D 50 MCG (2000 UT) tablet Take 2,000 Units by mouth daily.        Allergies  Allergen Reactions   Benadryl [Diphenhydramine] Other (See Comments)    UNK reaction ( not on MAR)   Imdur [Isosorbide Nitrate] Other (See Comments)    UNK reaction   Iodinated Contrast Media Other (See Comments)    UNK reaction   Mirtazapine Other (See Comments)    UNK reaction   Penicillins Other (See Comments)    UNK reaction   Shellfish Allergy Other (See Comments)    Unk reaction   Tramadol Other (See Comments)    UNK reaction    Consultations: None   Procedures/Studies: CT Head Wo Contrast  Result Date: 12/30/2021 CLINICAL DATA:  Fall injury.  Delirium.  Altered mental status. EXAM: CT HEAD WITHOUT CONTRAST TECHNIQUE: Contiguous axial images were obtained from the base of the skull through the vertex without intravenous contrast. RADIATION DOSE REDUCTION: This exam was performed according to the departmental dose-optimization program which includes automated exposure control, adjustment of the mA and/or kV according to patient size and/or use of iterative reconstruction technique. COMPARISON:  Head CT 12/28/2021. FINDINGS: Brain: There is moderate cerebral atrophy, small vessel disease and atrophic ventriculomegaly, without midline shift and with senescent calcification in the basal ganglia. Cerebellum and brainstem are unremarkable, as visualized. No asymmetry is seen worrisome for acute infarct, hemorrhage or mass. An 8 mm  calcified anterior left frontal parasagittal meningioma is incidentally noted. There is no mass effect. Vascular: There calcifications of the carotid siphons and distal vertebral arteries but no hyperdense central vasculature. Skull: Intact calvarium, skull base and orbits. No focal bone lesion. No visible scalp hematoma. Sinuses/Orbits: No acute abnormality. No appreciable mastoid effusion. Old lens replacements. Other: None. IMPRESSION: Chronic changes. No acute intracranial CT findings, depressed skull fractures or interval changes. Electronically Signed   By: Telford Nab M.D.   On: 12/30/2021 21:15   CT Head Wo Contrast  Result Date: 12/28/2021 CLINICAL DATA:  Trauma, fall EXAM: CT HEAD WITHOUT CONTRAST TECHNIQUE: Contiguous axial images were obtained from the base of the skull through the vertex without intravenous contrast. RADIATION DOSE REDUCTION: This exam was performed according to the departmental dose-optimization program which includes automated exposure control, adjustment of the mA and/or kV according to patient size and/or use of iterative reconstruction technique. COMPARISON:  None Available. FINDINGS: Brain: No acute intracranial findings are seen in noncontrast CT brain. There are no signs of bleeding. Cortical sulci  are prominent. There is decreased density in the periventricular white matter. Small calcifications are seen in the basal ganglia. Vascular: Scattered arterial calcifications are seen. Skull: No fracture is seen in the calvarium. Sinuses/Orbits: Paranasal sinuses are unremarkable. Postsurgical changes are noted in both optic globes. Other: None IMPRESSION: No acute intracranial findings are seen in noncontrast CT brain. Atrophy. Small-vessel disease. Electronically Signed   By: Elmer Picker M.D.   On: 12/28/2021 18:53   DG Chest Portable 1 View  Result Date: 12/30/2021 CLINICAL DATA:  Altered mental status EXAM: PORTABLE CHEST 1 VIEW COMPARISON:  None Available.  FINDINGS: Lungs are clear.  No pleural effusion or pneumothorax. The heart is normal in size. Median sternotomy. IMPRESSION: No evidence of acute cardiopulmonary disease. Electronically Signed   By: Julian Hy M.D.   On: 12/30/2021 21:18   (Echo, Carotid, EGD, Colonoscopy, ERCP)    Subjective: No complaints  Discharge Exam: Vitals:   01/03/22 2140 01/04/22 0549  BP: 139/79 135/69  Pulse: 71 61  Resp: 16 18  Temp: 97.8 F (36.6 C) 97.7 F (36.5 C)  SpO2: 98% 94%   Vitals:   01/03/22 0535 01/03/22 1257 01/03/22 2140 01/04/22 0549  BP: (!) 142/105 130/78 139/79 135/69  Pulse: 67 74 71 61  Resp: 18 16 16 18   Temp: 98 F (36.7 C) (!) 97.4 F (36.3 C) 97.8 F (36.6 C) 97.7 F (36.5 C)  TempSrc: Oral   Oral  SpO2: 96% 99% 98% 94%  Weight:    59.9 kg    General: Pt is alert, awake, not in acute distress Cardiovascular: RRR, S1/S2 +, no rubs, no gallops Respiratory: CTA bilaterally, no wheezing, no rhonchi Abdominal: Soft, NT, ND, bowel sounds + Extremities: no edema, no cyanosis    The results of significant diagnostics from this hospitalization (including imaging, microbiology, ancillary and laboratory) are listed below for reference.     Microbiology: Recent Results (from the past 240 hour(s))  Resp Panel by RT-PCR (Flu A&B, Covid) Nasopharyngeal Swab     Status: None   Collection Time: 12/30/21  8:57 PM   Specimen: Nasopharyngeal Swab; Nasopharyngeal(NP) swabs in vial transport medium  Result Value Ref Range Status   SARS Coronavirus 2 by RT PCR NEGATIVE NEGATIVE Final    Comment: (NOTE) SARS-CoV-2 target nucleic acids are NOT DETECTED.  The SARS-CoV-2 RNA is generally detectable in upper respiratory specimens during the acute phase of infection. The lowest concentration of SARS-CoV-2 viral copies this assay can detect is 138 copies/mL. A negative result does not preclude SARS-Cov-2 infection and should not be used as the sole basis for treatment or other  patient management decisions. A negative result may occur with  improper specimen collection/handling, submission of specimen other than nasopharyngeal swab, presence of viral mutation(s) within the areas targeted by this assay, and inadequate number of viral copies(<138 copies/mL). A negative result must be combined with clinical observations, patient history, and epidemiological information. The expected result is Negative.  Fact Sheet for Patients:  EntrepreneurPulse.com.au  Fact Sheet for Healthcare Providers:  IncredibleEmployment.be  This test is no t yet approved or cleared by the [place] FDA and  has been authorized for detection and/or diagnosis of SARS-CoV-2 by FDA under an Emergency Use Authorization (EUA). This EUA will remain  in effect (meaning this test can be used) for the duration of the COVID-19 declaration under Section 564(b)(1) of the Act, 21 U.S.C.section 360bbb-3(b)(1), unless the authorization is terminated  or revoked sooner.  Influenza A by PCR NEGATIVE NEGATIVE Final   Influenza B by PCR NEGATIVE NEGATIVE Final    Comment: (NOTE) The Xpert Xpress SARS-CoV-2/FLU/RSV plus assay is intended as an aid in the diagnosis of influenza from Nasopharyngeal swab specimens and should not be used as a sole basis for treatment. Nasal washings and aspirates are unacceptable for Xpert Xpress SARS-CoV-2/FLU/RSV testing.  Fact Sheet for Patients: EntrepreneurPulse.com.au  Fact Sheet for Healthcare Providers: IncredibleEmployment.be  This test is not yet approved or cleared by the [place] FDA and has been authorized for detection and/or diagnosis of SARS-CoV-2 by FDA under an Emergency Use Authorization (EUA). This EUA will remain in effect (meaning this test can be used) for the duration of the COVID-19 declaration under Section 564(b)(1) of the Act, 21 U.S.C. section  360bbb-3(b)(1), unless the authorization is terminated or revoked.  Performed at [facility], [place] [address], [place], [place] [address]      Labs: BNP (last 3 results) No results for input(s): BNP in the last 8760 hours. Basic Metabolic Panel: Recent Labs  Lab 12/28/21 2130 12/30/21 2121 12/31/21 0500 01/01/22 0435  NA 146* 143 144 144  K 3.7 4.6 3.8 3.7  CL 115* 112* 110 115*  CO2 24 26 25 22   GLUCOSE 116* 111* 107* 103*  BUN 23 28* 25* 25*  CREATININE 1.40* 1.95* 1.62* 1.55*  CALCIUM 8.8* 9.1 8.9 8.7*    Liver Function Tests: Recent Labs  Lab 12/28/21 2130 12/30/21 2121  AST 16 15  ALT 13 14  ALKPHOS 67 80  BILITOT 0.6 0.5  PROT 6.6 7.2  ALBUMIN 3.9 4.3    No results for input(s): LIPASE, AMYLASE in the last 168 hours. Recent Labs  Lab 12/31/21 0553  AMMONIA 25    CBC: Recent Labs  Lab 12/28/21 2130 12/30/21 2121 12/31/21 0500 01/01/22 0435  WBC 6.2 6.6 7.0 4.7  NEUTROABS 3.2 2.3  --   --   HGB 14.4 14.3 14.5 13.1  HCT 41.9 42.6 42.5 38.6*  MCV 92.5 91.8 90.4 92.3  PLT 142* 142* 143* 136*    Cardiac Enzymes: No results for input(s): CKTOTAL, CKMB, CKMBINDEX, TROPONINI in the last 168 hours. BNP: Invalid input(s): POCBNP CBG: No results for input(s): GLUCAP in the last 168 hours. D-Dimer No results for input(s): DDIMER in the last 72 hours. Hgb A1c No results for input(s): HGBA1C in the last 72 hours. Lipid Profile No results for input(s): CHOL, HDL, LDLCALC, TRIG, CHOLHDL, LDLDIRECT in the last 72 hours. Thyroid function studies No results for input(s): TSH, T4TOTAL, T3FREE, THYROIDAB in the last 72 hours.  Invalid input(s): FREET3 Anemia work up No results for input(s): VITAMINB12, FOLATE, FERRITIN, TIBC, IRON, RETICCTPCT in the last 72 hours. Urinalysis    Component Value Date/Time   COLORURINE STRAW (A) 12/30/2021 2054   APPEARANCEUR CLEAR 12/30/2021 2054   LABSPEC 1.009 12/30/2021 2054   PHURINE 6.0  12/30/2021 2054   GLUCOSEU NEGATIVE 12/30/2021 2054   HGBUR NEGATIVE 12/30/2021 2054   [place] NEGATIVE 12/30/2021 2054   [place] NEGATIVE 12/30/2021 2054   PROTEINUR NEGATIVE 12/30/2021 2054   NITRITE NEGATIVE 12/30/2021 2054   LEUKOCYTESUR NEGATIVE 12/30/2021 2054   Sepsis Labs Invalid input(s): PROCALCITONIN,  WBC,  LACTICIDVEN Microbiology Recent Results (from the past 240 hour(s))  Resp Panel by RT-PCR (Flu A&B, Covid) Nasopharyngeal Swab     Status: None   Collection Time: 12/30/21  8:57 PM   Specimen: Nasopharyngeal Swab; Nasopharyngeal(NP) swabs in vial transport medium  Result Value Ref Range Status   SARS Coronavirus 2 by RT PCR NEGATIVE NEGATIVE Final    Comment: (NOTE) SARS-CoV-2 target nucleic acids are NOT DETECTED.  The SARS-CoV-2 RNA is generally detectable in upper respiratory specimens during the acute phase of infection. The lowest concentration of SARS-CoV-2 viral copies this assay can detect is 138 copies/mL. A negative result does not preclude SARS-Cov-2 infection and should not be used as the sole basis for treatment or other patient management decisions. A negative result may occur with  improper specimen collection/handling, submission of specimen other than nasopharyngeal swab, presence of viral mutation(s) within the areas targeted by this assay, and inadequate number of viral copies(<138 copies/mL). A negative result must be combined with clinical observations, patient history, and epidemiological information. The expected result is Negative.  Fact Sheet for Patients:  EntrepreneurPulse.com.au  Fact Sheet for Healthcare Providers:  IncredibleEmployment.be  This test is no t yet approved or cleared by the [place] FDA and  has been authorized for detection and/or diagnosis of SARS-CoV-2 by FDA under an Emergency Use Authorization (EUA). This EUA will remain  in effect (meaning this test can be used)  for the duration of the COVID-19 declaration under Section 564(b)(1) of the Act, 21 U.S.C.section 360bbb-3(b)(1), unless the authorization is terminated  or revoked sooner.       Influenza A by PCR NEGATIVE NEGATIVE Final   Influenza B by PCR NEGATIVE NEGATIVE Final    Comment: (NOTE) The Xpert Xpress SARS-CoV-2/FLU/RSV plus assay is intended as an aid in the diagnosis of influenza from Nasopharyngeal swab specimens and should not be used as a sole basis for treatment. Nasal washings and aspirates are unacceptable for Xpert Xpress SARS-CoV-2/FLU/RSV testing.  Fact Sheet for Patients: EntrepreneurPulse.com.au  Fact Sheet for Healthcare Providers: IncredibleEmployment.be  This test is not yet approved or cleared by the [place] FDA and has been authorized for detection and/or diagnosis of SARS-CoV-2 by FDA under an Emergency Use Authorization (EUA). This EUA will remain in effect (meaning this test can be used) for the duration of the COVID-19 declaration under Section 564(b)(1) of the Act, 21 U.S.C. section 360bbb-3(b)(1), unless the authorization is terminated or revoked.  Performed at [facility], [place] [address], [place], [place] [address]      SIGNED:   Charlynne Cousins, MD  [facility] 01/04/2022, 8:20 AM Pager   If 7PM-7AM, please contact night-coverage [email or web address] Password [email or web address]

## 2022-01-04 NOTE — TOC Transition Note (Signed)
Transition of Care [facility]) - CM/SW Discharge Note  Patient Details  Name: Jose Gilbert MRN: [id number] Date of Birth: 03/21/1936  [facility] of [facility]) CM/SW Contact:  [name], LCSW Phone Number: 01/04/2022, 12:52 PM  Clinical Narrative: Patient had been out of restraints for over 24 hours and can return to [facility]. CSW spoke with [name] and confirmed patient can return. Discharge summary and FL2 faxed to facility [phone]). Daughter to transport patient back to facility. TOC signing off.  Final next level of care: Assisted Living Barriers to Discharge: Barriers Resolved  Patient Goals and CMS Choice Patient states their goals for this hospitalization and ongoing recovery are:: Return to [name] CMS Medicare.gov Compare Post Acute Care list provided to:: Patient Represenative (must comment) Choice offered to / list presented to : Adult Children  Discharge Placement Patient to be transferred to facility by: Daughter Patient and family notified of of transfer: 01/04/22  Discharge Plan and Services In-house Referral: Clinical Social Work Post Acute Care Choice: Home Health          DME Arranged: N/A DME Agency: NA HH Arranged: PT, OT HH Agency: [facility] Date [facility] Agency Contacted: 01/01/22 Representative spoke with at [facility] Agency: [name]  Readmission Risk Interventions     View : No data to display.

## 2022-01-04 NOTE — Progress Notes (Signed)
Discharge instructions given to patient and all questions were answered.  

## 2022-01-04 NOTE — NC FL2 (Signed)
[place] LEVEL OF CARE SCREENING TOOL     IDENTIFICATION  Patient Name: Jose Gilbert Birthdate: 03/21/1936 Sex: male Admission Date (Current Location): 12/30/2021  [facility] and [place] Number:  Herbalist and Address:  [facility],  [place], [place]      Provider Number: [id number]  Attending Physician Name and Address:  Charlynne Cousins, MD  Relative Name and Phone Number:  Maudie Mercury (daughter) Ph: [phone]    Current Level of Care: Hospital Recommended Level of Care: Assisted Living Facility Prior Approval Number:    Date Approved/Denied:   PASRR Number:    Discharge Plan: Other (Comment) [facility])    Current Diagnoses: Patient Active Problem List   Diagnosis Date Noted   Acute renal failure superimposed on stage 3a chronic kidney disease ([place]) 12/31/2021   CAD (coronary artery disease) 12/31/2021   AKI (acute kidney injury) ([place]) 12/31/2021   Protein-calorie malnutrition, severe 12/31/2021   Parkinson's disease ([place])    Acute encephalopathy    Hypertension     Orientation RESPIRATION BLADDER Height & Weight     Self  Normal Continent Weight: 132 lb 0.9 oz (59.9 kg) Height:     BEHAVIORAL SYMPTOMS/MOOD NEUROLOGICAL BOWEL NUTRITION STATUS      Continent Diet (Regular diet with meat cut up)  AMBULATORY STATUS COMMUNICATION OF NEEDS Skin   Limited Assist Verbally Other (Comment) (Ecchymosis: right eye)                       Personal Care Assistance Level of Assistance  Bathing, Feeding, Dressing Bathing Assistance: Limited assistance Feeding assistance: Limited assistance Dressing Assistance: Limited assistance     Functional Limitations Info  Sight, Hearing, Speech Sight Info: Adequate Hearing Info: Adequate Speech Info: Adequate    SPECIAL CARE FACTORS FREQUENCY  OT (By licensed OT), PT (By licensed PT)     PT Frequency: Min 3x's/week at facility OT Frequency:  Min 2x's/week at facility            Contractures Contractures Info: Not present    Additional Factors Info  Code Status, Allergies Code Status Info: Full Allergies Info: Benadryl (Diphenhydramine), Imdur (Isosorbide Nitrate), Iodinated Contrast Media, Mirtazapine, Penicillins, Shellfish Allergy, Tramadol           Current Medications (01/04/2022):  This is the current hospital active medication list Current Facility-Administered Medications  Medication Dose Route Frequency Provider Last Rate Last Admin   acetaminophen (TYLENOL) tablet 650 mg  650 mg Oral Q6H PRN Opyd, Ilene Qua, MD       Or   acetaminophen (TYLENOL) suppository 650 mg  650 mg Rectal Q6H PRN Opyd, Ilene Qua, MD       aspirin EC tablet 81 mg  81 mg Oral Daily Opyd, Ilene Qua, MD   81 mg at 01/04/22 0825   carbidopa-levodopa (SINEMET IR) 25-100 MG per tablet immediate release 2 tablet  2 tablet Oral 3 times per day Vianne Bulls, MD   2 tablet at 01/04/22 0825   feeding supplement (ENSURE ENLIVE / ENSURE PLUS) liquid 237 mL  237 mL Oral BID BM Charlynne Cousins, MD   237 mL at 01/04/22 0825   galantamine (RAZADYNE ER) 24 hr capsule 8 mg  8 mg Oral QODAY Opyd, Ilene Qua, MD   8 mg at 01/04/22 0825   haloperidol lactate (HALDOL) injection 1 mg  1 mg Intravenous Q6H PRN Charlynne Cousins, MD  heparin injection 5,000 Units  5,000 Units Subcutaneous Q8H Opyd, Ilene Qua, MD   5,000 Units at 01/04/22 0616   labetalol (NORMODYNE) injection 10 mg  10 mg Intravenous Q4H PRN Vianne Bulls, MD   10 mg at 12/31/21 0806   melatonin tablet 3 mg  3 mg Oral QHS Charlynne Cousins, MD   3 mg at 01/03/22 2045   multivitamin with minerals tablet 1 tablet  1 tablet Oral Daily Charlynne Cousins, MD   1 tablet at 01/04/22 0825   ondansetron (ZOFRAN) tablet 4 mg  4 mg Oral Q6H PRN Opyd, Ilene Qua, MD       Or   ondansetron (ZOFRAN) injection 4 mg  4 mg Intravenous Q6H PRN Opyd, Ilene Qua, MD       pantoprazole (PROTONIX)  EC tablet 40 mg  40 mg Oral Daily Opyd, Ilene Qua, MD   40 mg at 01/04/22 0825   QUEtiapine (SEROQUEL) tablet 25 mg  25 mg Oral QHS PRN Charlynne Cousins, MD   25 mg at 01/03/22 2045   senna-docusate (Senokot-S) tablet 1 tablet  1 tablet Oral QHS PRN Opyd, Ilene Qua, MD       simvastatin (ZOCOR) tablet 40 mg  40 mg Oral QHS Opyd, Ilene Qua, MD   40 mg at 01/03/22 2045     Discharge Medications: Please see discharge summary for a list of discharge medications.  Relevant Imaging Results:  Relevant Lab Results:   Additional Information SSN: [id number]  Sherie Don, LCSW

## 2022-01-13 ENCOUNTER — Emergency Department (HOSPITAL_BASED_OUTPATIENT_CLINIC_OR_DEPARTMENT_OTHER)
Admission: EM | Admit: 2022-01-13 | Discharge: 2022-01-13 | Disposition: A | Discharge status: Skilled Nursing Facility | Payer: No Typology Code available for payment source | Attending: Emergency Medicine | Admitting: Emergency Medicine

## 2022-01-13 ENCOUNTER — Emergency Department (HOSPITAL_BASED_OUTPATIENT_CLINIC_OR_DEPARTMENT_OTHER): Payer: No Typology Code available for payment source

## 2022-01-13 ENCOUNTER — Other Ambulatory Visit: Payer: Self-pay

## 2022-01-13 ENCOUNTER — Encounter (HOSPITAL_BASED_OUTPATIENT_CLINIC_OR_DEPARTMENT_OTHER): Payer: Self-pay | Admitting: Emergency Medicine

## 2022-01-13 ENCOUNTER — Emergency Department (HOSPITAL_BASED_OUTPATIENT_CLINIC_OR_DEPARTMENT_OTHER): Payer: No Typology Code available for payment source | Admitting: Radiology

## 2022-01-13 DIAGNOSIS — W19XXXA Unspecified fall, initial encounter: Secondary | ICD-10-CM

## 2022-01-13 DIAGNOSIS — I10 Essential (primary) hypertension: Secondary | ICD-10-CM

## 2022-01-13 DIAGNOSIS — W010XXA Fall on same level from slipping, tripping and stumbling without subsequent striking against object, initial encounter: Secondary | ICD-10-CM | POA: Diagnosis not present

## 2022-01-13 DIAGNOSIS — M545 Low back pain, unspecified: Secondary | ICD-10-CM | POA: Insufficient documentation

## 2022-01-13 DIAGNOSIS — Z7982 Long term (current) use of aspirin: Secondary | ICD-10-CM | POA: Diagnosis not present

## 2022-01-13 DIAGNOSIS — S0990XA Unspecified injury of head, initial encounter: Secondary | ICD-10-CM | POA: Diagnosis not present

## 2022-01-13 NOTE — ED Provider Notes (Signed)
[facility] EMERGENCY DEPT Provider Note   CSN: [id number] Arrival date & time: 01/13/22  2026     History {Add pertinent medical, surgical, social history, OB history to HPI:1} Chief Complaint  Patient presents with   Jose Gilbert is a 88 y.o. male.   Fall Patient brought from [place] assisted [facility].  Reportedly had mechanical fall.  Reportedly walking and got tangled in his walker.  Patient cannot really tell me what happened.  Complaining only of some low back pain.  Moving all extremities.  Denying headache but per EMS reportedly hit head.  No neck pain.     Home Medications Prior to Admission medications   Medication Sig Start Date End Date Taking? Authorizing Provider  acetaminophen (TYLENOL) 325 MG tablet Take 650 mg by mouth every 8 (eight) hours as needed for mild pain.    [provider]  aspirin EC 81 MG tablet Take 81 mg by mouth daily. 06/06/14   [provider]  BELSOMRA 5 MG TABS Take 1 tablet by mouth at bedtime. 12/23/21   [provider]  carbidopa-levodopa (SINEMET IR) 25-100 MG tablet Take 2 tablets by mouth in the morning, at noon, and at bedtime. 0900,1700,2100 12/19/21   [provider]  Cholecalciferol (VITAMIN D) 50 MCG (2000 UT) tablet Take 2,000 Units by mouth daily.    [provider]  Cranberry 200 MG CAPS Take 200 mg by mouth daily.    [provider]  docusate sodium (COLACE) 100 MG capsule Take 100 mg by mouth daily.    [provider]  galantamine (RAZADYNE ER) 8 MG 24 hr capsule Take 8 mg by mouth every other day. At bedtime 12/18/21   [provider]  nitroGLYCERIN (NITROSTAT) 0.4 MG SL tablet Place 0.4 mg under the tongue as directed. 10/16/21   [provider]  omeprazole (PRILOSEC) 20 MG capsule Take 20 mg by mouth daily. 12/16/21   [provider]  simvastatin (ZOCOR) 80 MG tablet Take 40 mg by mouth at bedtime. 12/18/21   [provider]  Suvorexant (BELSOMRA) 5 MG TABS Take 5 mg by mouth daily as needed (as needed for sleep).    [provider]  venlafaxine XR (EFFEXOR-XR) 75 MG 24 hr capsule Take 225 mg by mouth at bedtime. 10/23/21   [provider]      Allergies    Benadryl [diphenhydramine], Imdur [isosorbide nitrate], Iodinated contrast media, Mirtazapine, Penicillins, Shellfish allergy, and Tramadol    Review of Systems   Review of Systems  Physical Exam Updated Vital Signs BP (!) 194/81 (BP Location: Right Arm)   Pulse 67   Temp 98.7 F (37.1 C)   Resp 15   Ht 5\' 8"  (1.727 m)   Wt 60 kg   SpO2 99%   BMI 20.11 kg/m  Physical Exam Vitals and nursing note reviewed.  HENT:     Head: Atraumatic.  Eyes:     Pupils: Pupils are equal, round, and reactive to light.  Chest:     Chest wall: No tenderness.  Abdominal:     Tenderness: There is no abdominal tenderness.  Musculoskeletal:        General: Tenderness present.     Cervical back: Neck supple. No tenderness.     Comments: Lumbar tenderness without step-off or deformity.  Neurological:     Mental Status: He is alert.     Comments: Awake and pleasant but may have some mild confusion.  ED Results / Procedures / Treatments   Labs (all labs ordered are listed, but only abnormal results are displayed) Labs Reviewed - No data to display  EKG None  Radiology DG Lumbar Spine Complete  Result Date: 01/13/2022 CLINICAL DATA:  Fall EXAM: LUMBAR SPINE - COMPLETE 4+ VIEW COMPARISON:  None Available. FINDINGS: Five lumbar-type vertebral bodies. Normal lumbar lordosis. No evidence of fracture or dislocation. Vertebral body heights are maintained. Mild degenerative changes of the lower thoracic spine. Visualized bony pelvis appears intact. Vascular calcifications. IMPRESSION: Negative. Electronically Signed   By: Charline Bills M.D.   On: 01/13/2022 21:11    Procedures Procedures  {Document cardiac monitor, telemetry  assessment procedure when appropriate:1}  Medications Ordered in ED Medications - No data to display  ED Course/ Medical Decision Making/ A&P                           Medical Decision Making Amount and/or Complexity of Data Reviewed Radiology: ordered.   ***  {Document critical care time when appropriate:1} {Document review of labs and clinical decision tools ie heart score, Chads2Vasc2 etc:1}  {Document your independent review of radiology images, and any outside records:1} {Document your discussion with family members, caretakers, and with consultants:1} {Document social determinants of health affecting pt's care:1} {Document your decision making why or why not admission, treatments were needed:1} Final Clinical Impression(s) / ED Diagnoses Final diagnoses:  None    Rx / DC Orders ED Discharge Orders     None

## 2022-01-13 NOTE — ED Notes (Signed)
Nurse has attempted to return cal to [facility] and give an update and no answer and unable to leave a VM.   Attempted to cal [facility] and only option is to leave a message and they will call me back

## 2022-01-13 NOTE — Discharge Instructions (Signed)
No clear injury was seen from the fall.  Your blood pressure is somewhat elevated however.  Follow-up with your doctors for further management of it

## 2022-01-13 NOTE — ED Triage Notes (Addendum)
Pt BIB EMS from [place] after an unwitnessed fall. Pt was walking to bed and got tangled up in his walker and fell hitting the left side of his head. No blood thinners.

## 2022-01-24 ENCOUNTER — Encounter (HOSPITAL_BASED_OUTPATIENT_CLINIC_OR_DEPARTMENT_OTHER): Payer: Self-pay | Admitting: Emergency Medicine

## 2022-01-24 ENCOUNTER — Other Ambulatory Visit: Payer: Self-pay

## 2022-01-24 ENCOUNTER — Emergency Department (HOSPITAL_BASED_OUTPATIENT_CLINIC_OR_DEPARTMENT_OTHER)
Admission: EM | Admit: 2022-01-24 | Discharge: 2022-01-25 | Disposition: A | Discharge status: Skilled Nursing Facility | Payer: No Typology Code available for payment source | Attending: Emergency Medicine | Admitting: Emergency Medicine

## 2022-01-24 DIAGNOSIS — K6289 Other specified diseases of anus and rectum: Secondary | ICD-10-CM | POA: Diagnosis present

## 2022-01-24 DIAGNOSIS — F0390 Unspecified dementia without behavioral disturbance: Secondary | ICD-10-CM | POA: Insufficient documentation

## 2022-01-24 DIAGNOSIS — Z79899 Other long term (current) drug therapy: Secondary | ICD-10-CM | POA: Insufficient documentation

## 2022-01-24 DIAGNOSIS — K5641 Fecal impaction: Secondary | ICD-10-CM

## 2022-01-24 DIAGNOSIS — Z7982 Long term (current) use of aspirin: Secondary | ICD-10-CM | POA: Insufficient documentation

## 2022-01-24 DIAGNOSIS — I10 Essential (primary) hypertension: Secondary | ICD-10-CM | POA: Insufficient documentation

## 2022-01-24 DIAGNOSIS — I2510 Atherosclerotic heart disease of native coronary artery without angina pectoris: Secondary | ICD-10-CM | POA: Diagnosis not present

## 2022-01-24 DIAGNOSIS — G20 Parkinson's disease: Secondary | ICD-10-CM | POA: Insufficient documentation

## 2022-01-24 MED ORDER — FLEET ENEMA 7-19 GM/118ML RE ENEM
1.0000 | ENEMA | Freq: Once | RECTAL | Status: AC
Start: 2022-01-24 — End: 2022-01-24
  Administered 2022-01-24: 1 via RECTAL
  Filled 2022-01-24: qty 1

## 2022-01-24 NOTE — ED Provider Notes (Signed)
[facility] EMERGENCY DEPT Provider Note   CSN: [id number] Arrival date & time: 01/24/22  1951     History {Add pertinent medical, surgical, social history, OB history to HPI:1} Chief Complaint  Patient presents with   Rectal Pain   Diarrhea    Daishawn Lauf is a 88 y.o. male.  Patient is an 88 year old male with a history of Parkinson's disease, hypertension, dementia and CAD who is presenting today due to complaint of rectal pain.  Patient reports the pain started yesterday.  It comes in waves and he feels like he is good to have a bowel movement and when that happens he starts getting severe sharp pain in his rectum.  It then will release for a while and return.  He denies any abdominal pain, nausea or vomiting but he has had liquid brown stool leaking out of his rectum.  He has no urinary complaints.  He is not sure if he uses a stool softener.  The history is provided by the patient.  Diarrhea      Home Medications Prior to Admission medications   Medication Sig Start Date End Date Taking? Authorizing Provider  acetaminophen (TYLENOL) 325 MG tablet Take 650 mg by mouth every 8 (eight) hours as needed for mild pain.    [provider]  aspirin EC 81 MG tablet Take 81 mg by mouth daily. 06/06/14   [provider]  BELSOMRA 5 MG TABS Take 1 tablet by mouth at bedtime. 12/23/21   [provider]  carbidopa-levodopa (SINEMET IR) 25-100 MG tablet Take 2 tablets by mouth in the morning, at noon, and at bedtime. 0900,1700,2100 12/19/21   [provider]  Cholecalciferol (VITAMIN D) 50 MCG (2000 UT) tablet Take 2,000 Units by mouth daily.    [provider]  Cranberry 200 MG CAPS Take 200 mg by mouth daily.    [provider]  docusate sodium (COLACE) 100 MG capsule Take 100 mg by mouth daily.    [provider]  galantamine (RAZADYNE ER) 8 MG 24 hr capsule Take 8 mg by mouth every other day. At bedtime 12/18/21    [provider]  nitroGLYCERIN (NITROSTAT) 0.4 MG SL tablet Place 0.4 mg under the tongue as directed. 10/16/21   [provider]  omeprazole (PRILOSEC) 20 MG capsule Take 20 mg by mouth daily. 12/16/21   [provider]  simvastatin (ZOCOR) 80 MG tablet Take 40 mg by mouth at bedtime. 12/18/21   [provider]  Suvorexant (BELSOMRA) 5 MG TABS Take 5 mg by mouth daily as needed (as needed for sleep).    [provider]  venlafaxine XR (EFFEXOR-XR) 75 MG 24 hr capsule Take 225 mg by mouth at bedtime. 10/23/21   [provider]      Allergies    Benadryl [diphenhydramine], Imdur [isosorbide nitrate], Iodinated contrast media, Mirtazapine, Penicillins, Shellfish allergy, and Tramadol    Review of Systems   Review of Systems  Gastrointestinal:  Positive for diarrhea.    Physical Exam Updated Vital Signs BP (!) 148/70 (BP Location: Right Arm)   Pulse 60   Temp 98.5 F (36.9 C) (Oral)   Resp 18   Ht 5\' 8"  (1.727 m)   Wt 65 kg   SpO2 99%   BMI 21.79 kg/m  Physical Exam Vitals and nursing note reviewed.  Constitutional:      General: He is not in acute distress.    Appearance: He is well-developed.  HENT:  Head: Normocephalic and atraumatic.  Eyes:     Conjunctiva/sclera: Conjunctivae normal.     Pupils: Pupils are equal, round, and reactive to light.  Cardiovascular:     Rate and Rhythm: Normal rate and regular rhythm.     Heart sounds: No murmur heard. Pulmonary:     Effort: Pulmonary effort is normal. No respiratory distress.     Breath sounds: Normal breath sounds. No wheezing or rales.  Abdominal:     General: There is no distension.     Palpations: Abdomen is soft.     Tenderness: There is no abdominal tenderness. There is no guarding or rebound.  Genitourinary:    Comments: Fecal impaction on exam with liquid brown stool leaking around.  On attempts to manually disimpact patient is unable to tolerate. Musculoskeletal:         General: No tenderness. Normal range of motion.     Cervical back: Normal range of motion and neck supple.  Skin:    General: Skin is warm and dry.     Findings: No erythema or rash.  Neurological:     Mental Status: He is alert and oriented to person, place, and time.  Psychiatric:        Behavior: Behavior normal.     ED Results / Procedures / Treatments   Labs (all labs ordered are listed, but only abnormal results are displayed) Labs Reviewed - No data to display  EKG None  Radiology No results found.  Procedures Procedures  {Document cardiac monitor, telemetry assessment procedure when appropriate:1}  Medications Ordered in ED Medications  sodium phosphate (FLEET) 7-19 GM/118ML enema 1 enema (has no administration in time range)    ED Course/ Medical Decision Making/ A&P                           Medical Decision Making Risk OTC drugs.   Pt with multiple medical problems and comorbidities and presenting today with a complaint that caries a high risk for morbidity and mortality.  Patient is presenting today with complaints of rectal pain and appears to have fecal impaction.  No findings today to suggest small bowel obstruction, diverticulitis or other acute intestinal pathology.  Patient is well-appearing with reassuring vital signs.  Patient will need an enema at which time hopefully he can defecate and symptoms will resolve.  There is no blood in the stool noted on inspection.   {Document critical care time when appropriate:1} {Document review of labs and clinical decision tools ie heart score, Chads2Vasc2 etc:1}  {Document your independent review of radiology images, and any outside records:1} {Document your discussion with family members, caretakers, and with consultants:1} {Document social determinants of health affecting pt's care:1} {Document your decision making why or why not admission, treatments were needed:1} Final Clinical Impression(s) / ED  Diagnoses Final diagnoses:  None    Rx / DC Orders ED Discharge Orders     None

## 2022-01-24 NOTE — ED Triage Notes (Signed)
Pt via ems from [facility]. Per EMS pt has severe rectal pain and 1 or 3 days of diarrhea. Denies fever, just started memantine 3 days ago. VS 128/70 HR 75, O2 98 on RA; CBG 195

## 2022-01-24 NOTE — ED Triage Notes (Signed)
Pt has dementia

## 2022-01-25 MED ORDER — FLEET ENEMA 7-19 GM/118ML RE ENEM
2.0000 | ENEMA | Freq: Once | RECTAL | Status: AC
  Administered 2022-01-24: 2 via RECTAL

## 2022-01-25 MED ORDER — BOUDREAUXS BUTT PASTE 16 % EX OINT: 1.0000 | TOPICAL_OINTMENT | Freq: Three times a day (TID) | CUTANEOUS | 0 refills | Status: DC

## 2022-01-25 MED ORDER — POLYETHYLENE GLYCOL 3350 17 GM/SCOOP PO POWD: 1.0000 | Freq: Once | ORAL | 0 refills | Status: AC

## 2022-01-25 NOTE — ED Notes (Signed)
Called [facility] for [facility] transportation, patient is next on list.

## 2022-01-25 NOTE — ED Notes (Signed)
[facility] arrived to transport pt. Pt stable at time of departure. 

## 2022-01-25 NOTE — Discharge Instructions (Addendum)
Patient is constipated and has fecal impaction.  He received 2 enemas here and did have some stool removal but will need to start laxatives to get the rest of the stool out.  If he starts having abdominal pain, vomiting, fever or inability to eat he should return to the emergency room.  When he is having bowel movements he will have some rectal discomfort.  He has significant breakdown of the skin in his buttocks area from the stool irritating the area and he needs to have Butt paste applied as a barrier.

## 2022-02-28 ENCOUNTER — Emergency Department (HOSPITAL_COMMUNITY): Payer: No Typology Code available for payment source

## 2022-02-28 ENCOUNTER — Encounter (HOSPITAL_COMMUNITY): Payer: Self-pay

## 2022-02-28 ENCOUNTER — Emergency Department (HOSPITAL_COMMUNITY)
Admission: EM | Admit: 2022-02-28 | Discharge: 2022-03-01 | Disposition: A | Discharge status: Home / Self Care | Payer: No Typology Code available for payment source | Attending: Emergency Medicine | Admitting: Emergency Medicine

## 2022-02-28 ENCOUNTER — Other Ambulatory Visit: Payer: Self-pay

## 2022-02-28 DIAGNOSIS — W1839XA Other fall on same level, initial encounter: Secondary | ICD-10-CM | POA: Diagnosis not present

## 2022-02-28 DIAGNOSIS — I160 Hypertensive urgency: Secondary | ICD-10-CM

## 2022-02-28 DIAGNOSIS — F0390 Unspecified dementia without behavioral disturbance: Secondary | ICD-10-CM | POA: Insufficient documentation

## 2022-02-28 DIAGNOSIS — G20 Parkinson's disease: Secondary | ICD-10-CM | POA: Insufficient documentation

## 2022-02-28 DIAGNOSIS — W19XXXA Unspecified fall, initial encounter: Secondary | ICD-10-CM

## 2022-02-28 DIAGNOSIS — N189 Chronic kidney disease, unspecified: Secondary | ICD-10-CM | POA: Diagnosis not present

## 2022-02-28 DIAGNOSIS — Z7982 Long term (current) use of aspirin: Secondary | ICD-10-CM | POA: Diagnosis not present

## 2022-02-28 LAB — CBC WITH DIFFERENTIAL/PLATELET
Abs Immature Granulocytes: 0.02 10*3/uL (ref 0.00–0.07)
Basophils Absolute: 0 10*3/uL (ref 0.0–0.1)
Basophils Relative: 0 %
Eosinophils Absolute: 0.3 10*3/uL (ref 0.0–0.5)
Eosinophils Relative: 4 %
HCT: 38.3 % — ABNORMAL LOW (ref 39.0–52.0)
Hemoglobin: 12.7 g/dL — ABNORMAL LOW (ref 13.0–17.0)
Immature Granulocytes: 0 %
Lymphocytes Relative: 27 %
Lymphs Abs: 1.9 10*3/uL (ref 0.7–4.0)
MCH: 30.8 pg (ref 26.0–34.0)
MCHC: 33.2 g/dL (ref 30.0–36.0)
MCV: 92.7 fL (ref 80.0–100.0)
Monocytes Absolute: 0.7 10*3/uL (ref 0.1–1.0)
Monocytes Relative: 10 %
Neutro Abs: 4.3 10*3/uL (ref 1.7–7.7)
Neutrophils Relative %: 59 %
Platelets: 128 10*3/uL — ABNORMAL LOW (ref 150–400)
RBC: 4.13 MIL/uL — ABNORMAL LOW (ref 4.22–5.81)
RDW: 13.1 % (ref 11.5–15.5)
WBC: 7.3 10*3/uL (ref 4.0–10.5)
nRBC: 0 % (ref 0.0–0.2)

## 2022-02-28 LAB — BASIC METABOLIC PANEL
Anion gap: 10 (ref 5–15)
BUN: 19 mg/dL (ref 8–23)
CO2: 24 mmol/L (ref 22–32)
Calcium: 8.2 mg/dL — ABNORMAL LOW (ref 8.9–10.3)
Chloride: 106 mmol/L (ref 98–111)
Creatinine, Ser: 1.63 mg/dL — ABNORMAL HIGH (ref 0.61–1.24)
GFR, Estimated: 40 mL/min — ABNORMAL LOW (ref >60)
Glucose, Bld: 113 mg/dL — ABNORMAL HIGH (ref 70–99)
Potassium: 3.5 mmol/L (ref 3.5–5.1)
Sodium: 140 mmol/L (ref 135–145)

## 2022-02-28 LAB — URINALYSIS, ROUTINE W REFLEX MICROSCOPIC
Bilirubin Urine: NEGATIVE
Glucose, UA: NEGATIVE mg/dL
Hgb urine dipstick: NEGATIVE
Ketones, ur: 5 mg/dL — AB
Leukocytes,Ua: NEGATIVE
Nitrite: NEGATIVE
Protein, ur: NEGATIVE mg/dL
Specific Gravity, Urine: 1.017 (ref 1.005–1.030)
pH: 6 (ref 5.0–8.0)

## 2022-02-28 MED ORDER — LOPERAMIDE HCL 2 MG PO CAPS: 2.0000 mg | ORAL_CAPSULE | ORAL | Status: DC | PRN

## 2022-02-28 MED ORDER — ATORVASTATIN CALCIUM 40 MG PO TABS
40.0000 mg | ORAL_TABLET | Freq: Every day | ORAL | Status: DC
  Administered 2022-03-01: 40 mg via ORAL
  Filled 2022-02-28: qty 1

## 2022-02-28 MED ORDER — CARBIDOPA-LEVODOPA 25-100 MG PO TABS
2.0000 | ORAL_TABLET | ORAL | Status: DC
Start: 2022-03-01 — End: 2022-03-01
  Administered 2022-03-01: 2 via ORAL
  Filled 2022-02-28: qty 2

## 2022-02-28 MED ORDER — LOPERAMIDE HCL 2 MG PO TABS: 2.0000 mg | ORAL_TABLET | ORAL | Status: DC | PRN

## 2022-02-28 MED ORDER — PANTOPRAZOLE SODIUM 40 MG PO TBEC
40.0000 mg | DELAYED_RELEASE_TABLET | Freq: Every day | ORAL | Status: DC
  Administered 2022-03-01: 40 mg via ORAL
  Filled 2022-02-28: qty 1

## 2022-02-28 MED ORDER — ACETAMINOPHEN 325 MG PO TABS: 650.0000 mg | ORAL_TABLET | Freq: Three times a day (TID) | ORAL | Status: DC | PRN

## 2022-02-28 MED ORDER — SENNOSIDES-DOCUSATE SODIUM 8.6-50 MG PO TABS
1.0000 | ORAL_TABLET | Freq: Every day | ORAL | Status: DC
  Administered 2022-03-01: 1 via ORAL
  Filled 2022-02-28: qty 1

## 2022-02-28 MED ORDER — CARBIDOPA-LEVODOPA 25-100 MG PO TABS: 2.0000 | ORAL_TABLET | ORAL | Status: DC

## 2022-02-28 MED ORDER — DOCUSATE SODIUM 100 MG PO CAPS
100.0000 mg | ORAL_CAPSULE | Freq: Every day | ORAL | Status: DC
  Administered 2022-03-01: 100 mg via ORAL
  Filled 2022-02-28: qty 1

## 2022-02-28 MED ORDER — ASPIRIN 81 MG PO TBEC
81.0000 mg | DELAYED_RELEASE_TABLET | Freq: Every day | ORAL | Status: DC
  Administered 2022-03-01: 81 mg via ORAL
  Filled 2022-02-28: qty 1

## 2022-02-28 MED ORDER — SUVOREXANT 5 MG PO TABS: 5.0000 mg | ORAL_TABLET | Freq: Every day | ORAL | Status: DC

## 2022-02-28 MED ORDER — VENLAFAXINE HCL ER 75 MG PO CP24
225.0000 mg | ORAL_CAPSULE | Freq: Every day | ORAL | Status: DC
  Administered 2022-03-01: 225 mg via ORAL
  Filled 2022-02-28 (×2): qty 1

## 2022-02-28 NOTE — Care Management (Incomplete)
ED RN CM spoke with patient's daughter Dionne Ano  [phone]  concerning patient not meeting the criteria for admission or ED placement to a long term facility. Daughter states she under

## 2022-02-28 NOTE — ED Triage Notes (Signed)
BIB [facility] after being found 1-2 miles away from [place]. EMS states bystander called 911 after seeing him laying on down on sidewalk. Unknown LOC, no thinners. L hip tender to palpation. Confused on arrival. Hx of parkinsons.

## 2022-02-28 NOTE — ED Provider Notes (Signed)
[name] EMERGENCY DEPARTMENT Provider Note   CSN: [id number] Arrival date & time: 02/28/22  2010     History  Chief Complaint  Patient presents with   Jose Gilbert is a 88 y.o. male.  Patient with history of dementia/Parkinson's disease, chronic kidney disease --presents to the emergency department after being found down along [facility].  Patient wandered out of his assisted living facility, [facility], today.  This is the first time that he has wandered off like this.  He cannot give details of what happened to him.  Questionable head injury, but no obvious signs of trauma.  Patient was placed in a cervical collar by EMS.  Per EMS report, question of left hip tenderness, however patient denies pain in his hips.  Patient's daughter at bedside reports decline in mental status and balance, especially over the past 1 month.  No recent fevers, URI symptoms, vomiting, diarrhea. Negative anticoagulation.        Home Medications Prior to Admission medications   Medication Sig Start Date End Date Taking? Authorizing Provider  acetaminophen (TYLENOL) 325 MG tablet Take 650 mg by mouth every 8 (eight) hours as needed for mild pain.   Yes [provider]  aspirin EC 81 MG tablet Take 81 mg by mouth at bedtime. 06/06/14  Yes [provider]  carbidopa-levodopa (SINEMET IR) 25-100 MG tablet Take 2 tablets by mouth See admin instructions. 2 tablets at 0900, 2 tablets at 1700, and 2 tablets at 2100 12/19/21  Yes [provider]  Cranberry 200 MG CAPS Take 200 mg by mouth daily.   Yes [provider]  docusate sodium (COLACE) 100 MG capsule Take 100 mg by mouth daily.   Yes [provider]  loperamide (IMODIUM A-D) 2 MG tablet Take 2 mg by mouth every 4 (four) hours as needed for diarrhea or loose stools.   Yes [provider]  nitroGLYCERIN (NITROSTAT) 0.4 MG SL tablet Place 0.4 mg under the tongue See admin  instructions. Every 15 minutes as needed for chest pain 10/16/21  Yes [provider]  omeprazole (PRILOSEC) 20 MG capsule Take 20 mg by mouth daily. 12/16/21  Yes [provider]  polyethylene glycol (MIRALAX / GLYCOLAX) 17 g packet Take 17 g by mouth every 6 (six) hours as needed for mild constipation.   Yes [provider]  senna-docusate (SENOKOT-S) 8.6-50 MG tablet Take 1 tablet by mouth at bedtime.   Yes [provider]  simvastatin (ZOCOR) 80 MG tablet Take 40 mg by mouth at bedtime. 12/18/21  Yes [provider]  Suvorexant (BELSOMRA) 5 MG TABS Take 5 mg by mouth at bedtime.   Yes [provider]  venlafaxine XR (EFFEXOR-XR) 75 MG 24 hr capsule Take 225 mg by mouth at bedtime. 10/23/21  Yes [provider]  Zinc Oxide (BOUDREAUXS BUTT PASTE) 16 % OINT Apply 1 Application topically 3 (three) times daily. Needs to be placed in the buttocks area where there is skin breakdown 3 times a day 01/25/22  Yes Gwyneth Sprout, MD      Allergies    Benadryl [diphenhydramine], Imdur [isosorbide nitrate], Iodinated contrast media, Mirtazapine, Penicillins, Shellfish allergy, and Tramadol    Review of Systems   Review of Systems  Physical Exam Updated Vital Signs BP (!) 158/77   Pulse 71   Temp 98.2 F (36.8 C)   Resp 19   Ht 5\' 8"  (1.727 m)   Wt 63.5 kg  SpO2 96%   BMI 21.29 kg/m  Physical Exam Vitals and nursing note reviewed.  Constitutional:      General: He is not in acute distress.    Appearance: He is well-developed.  HENT:     Head: Normocephalic and atraumatic.     Comments: No palpable or visible trauma to the head    Nose: No congestion.     Mouth/Throat:     Mouth: Mucous membranes are moist.  Eyes:     General:        Right eye: No discharge.        Left eye: No discharge.     Conjunctiva/sclera: Conjunctivae normal.  Neck:     Comments: Immobilized in cervical collar Cardiovascular:     Rate and Rhythm:  Normal rate and regular rhythm.     Heart sounds: Normal heart sounds.     Comments: Healed sternotomy noted Pulmonary:     Effort: Pulmonary effort is normal.     Breath sounds: Normal breath sounds.  Abdominal:     Palpations: Abdomen is soft.     Tenderness: There is no abdominal tenderness. There is no guarding or rebound.  Musculoskeletal:     Cervical back: Neck supple. No tenderness.     Thoracic back: No tenderness.     Lumbar back: No tenderness.     Comments: Lower extremities, I can passively range the patient's bilateral hips in flexion and extension, internal and external rotation, and also flex and extend at the knees without eliciting any pain in the patient.  Skin:    General: Skin is warm and dry.  Neurological:     General: No focal deficit present.     Mental Status: He is alert. Mental status is at baseline.  Psychiatric:        Mood and Affect: Mood normal.     ED Results / Procedures / Treatments   Labs (all labs ordered are listed, but only abnormal results are displayed) Labs Reviewed  CBC WITH DIFFERENTIAL/PLATELET - Abnormal; Notable for the following components:      Result Value   RBC 4.13 (*)    Hemoglobin 12.7 (*)    HCT 38.3 (*)    Platelets 128 (*)    All other components within normal limits  BASIC METABOLIC PANEL - Abnormal; Notable for the following components:   Glucose, Bld 113 (*)    Creatinine, Ser 1.63 (*)    Calcium 8.2 (*)    GFR, Estimated 40 (*)    All other components within normal limits  URINALYSIS, ROUTINE W REFLEX MICROSCOPIC - Abnormal; Notable for the following components:   Ketones, ur 5 (*)    All other components within normal limits   ED ECG REPORT   Date: 02/28/2022  Rate: 70  Rhythm: normal sinus rhythm  QRS Axis: left  Intervals: prolonged PR  ST/T Wave abnormalities: normal  Conduction Disutrbances:first-degree A-V block   Narrative Interpretation:   Old EKG Reviewed: unchanged from 01/13/22  I have  personally reviewed the EKG tracing and agree with the computerized printout as noted.   Radiology DG Hip Unilat W or Wo Pelvis 2-3 Views Left  Result Date: 02/28/2022 CLINICAL DATA:  Found down, left hip pain, dementia, with tenderness to palpation over the left hip. EXAM: DG HIP (WITH OR WITHOUT PELVIS) 2-3V LEFT COMPARISON:  None Available. FINDINGS: There is no evidence of hip fracture or dislocation. There is mild osteopenia. No pelvic fracture or diastasis is seen.  There is mild symmetric arthrosis of the hips and SI joints. Mild enthesopathic changes involve the bony pelvis. There is patchy calcification of the iliofemoral arteries. There are surgical clips in the upper medial left thigh. IMPRESSION: 1. Osteopenia and degenerative change without evidence of fractures. 2. Iliofemoral atherosclerosis. Electronically Signed   By: Almira Bar M.D.   On: 02/28/2022 21:56   CT HEAD WO CONTRAST ( )  Result Date: 02/28/2022 CLINICAL DATA:  Head trauma, minor (Age >= 65y) found down, dementia EXAM: CT HEAD WITHOUT CONTRAST TECHNIQUE: Contiguous axial images were obtained from the base of the skull through the vertex without intravenous contrast. RADIATION DOSE REDUCTION: This exam was performed according to the departmental dose-optimization program which includes automated exposure control, adjustment of the mA and/or kV according to patient size and/or use of iterative reconstruction technique. COMPARISON:  01/13/2022 FINDINGS: Brain: Normal anatomic configuration. Parenchymal volume loss is commensurate with the patient's age. Mild periventricular white matter changes are present likely reflecting the sequela of small vessel ischemia. Remote lacunar infarct noted within the left corona radiata. No abnormal intra or extra-axial mass lesion or fluid collection. No abnormal mass effect or midline shift. No evidence of acute intracranial hemorrhage or infarct. Ventricular size is normal. Cerebellum  unremarkable. Vascular: No asymmetric hyperdense vasculature at the skull base. Skull: Intact Sinuses/Orbits: Paranasal sinuses are clear. Ocular lenses have been removed. Orbits are otherwise unremarkable. Other: Mastoid air cells and middle ear cavities are clear. Multiple nonspecific soft tissue scalp nodule seen at the vertex are again noted. IMPRESSION: No acute intracranial abnormality. Electronically Signed   By: Helyn Numbers M.D.   On: 02/28/2022 21:51   CT Cervical Spine Wo Contrast  Result Date: 02/28/2022 CLINICAL DATA:  Neck trauma EXAM: CT CERVICAL SPINE WITHOUT CONTRAST TECHNIQUE: Multidetector CT imaging of the cervical spine was performed without intravenous contrast. Multiplanar CT image reconstructions were also generated. RADIATION DOSE REDUCTION: This exam was performed according to the departmental dose-optimization program which includes automated exposure control, adjustment of the mA and/or kV according to patient size and/or use of iterative reconstruction technique. COMPARISON:  None Available. FINDINGS: Alignment: Alignment is grossly anatomic. Skull base and vertebrae: No acute fracture. No primary bone lesion or focal pathologic process. Soft tissues and spinal canal: No prevertebral fluid or swelling. No visible canal hematoma. Disc levels: Diffuse multilevel spondylosis and facet hypertrophy. Facet hypertrophic changes are most pronounced from C3 through C5. Spondylosis most pronounced at C6-7. Upper chest: Airway is patent.  Lung apices are clear. Other: Reconstructed images demonstrate no additional findings. IMPRESSION: 1. Extensive multilevel cervical degenerative changes. 2. No acute displaced fracture. Electronically Signed   By: Sharlet Salina M.D.   On: 02/28/2022 21:44    Procedures Procedures    Medications Ordered in ED Medications - No data to display  ED Course/ Medical Decision Making/ A&P    Patient seen and examined. History obtained directly from EMS  report and patient's daughter at bedside.    Labs/EKG: Ordered CBC, BMP, UA, EKG.  Imaging: Ordered CT head, CT cervical spine, x-ray of the pelvis.  Medications/Fluids: None ordered  Most recent vital signs reviewed and are as follows: BP (!) 158/77   Pulse 71   Temp 98.2 F (36.8 C)   Resp 19   Ht 5\' 8"  (1.727 m)   Wt 63.5 kg   SpO2 96%   BMI 21.29 kg/m   Initial impression: Dementia, fall  10:31 PM Reassessment performed. Patient appears stable.  I removed c-collar.  Labs personally reviewed and interpreted including: CBC normal white blood cell count, mild anemia, platelets 128; BMP chronic kidney disease with creatinine 1.63 at baseline, glucose 113; UA negative for infection.  Imaging personally visualized and interpreted including: CT of the head, cervical spine agree no acute findings; x-ray of the hip and pelvis, agree no fracture.  Reviewed pertinent lab work and imaging with at bedside. Questions answered.   Nurse case manager Sherwood Gambler has been involved in patient's care. Return to current placement is difficult because patient is in assisted living and is now considered a flight risk. Chip Boer has memory care, but he will now require higher level of care given recent decompensation.   Most current vital signs reviewed and are as follows: BP (!) 173/83   Pulse 71   Temp 98.2 F (36.8 C)   Resp 14   Ht 5\' 8"  (1.727 m)   Wt 63.5 kg   SpO2 99%   BMI 21.29 kg/m   Plan: Currently plan to have patient board in ED overnight and obtain PT eval in AM to provide evaluation. Discussed that after eval, patient will need to return to current facility as long-term placement thru ED will not be viable. Case manager and myself have both discussed with daughter who verbalizes understanding. Daughter has through hospice who she will be touching base with ASAP tomorrow morning.  I have ordered patient's home medications.                            Medical  Decision Making Amount and/or Complexity of Data Reviewed Labs: ordered. Radiology: ordered.   Patient wandered out of his assisted living facility today and was found on the ground away from his facility.  Patient transported by EMS.  Patient was evaluated with imaging of the head and neck as well as pelvis.  Imaging was reassuring and negative for acute findings.  Also checked CBC, BMP, and UA.  No acute findings.  Patient has had progressively worsening dementia symptoms over the past several weeks.  This is likely contributing to the event today.  Currently awaiting physical therapy eval in the a.m. daughter will be in touch with social worker regarding transferring patient to higher level of care.        Final Clinical Impression(s) / ED Diagnoses Final diagnoses:  Dementia, unspecified dementia severity, unspecified dementia type, unspecified whether behavioral, psychotic, or mood disturbance or anxiety (HCC)  Fall, initial encounter    Rx / DC Orders ED Discharge Orders     None         Child psychotherapist, Renne Crigler 02/28/22 2245    03/02/22, MD 03/04/22 1447

## 2022-02-28 NOTE — Care Management (Signed)
ED RN CM spoke with patient's daughter Dionne Ano  [phone]  concerning patient not meeting the criteria for admission or ED placement to a long term facility. Daughter states she understand, but daughter requesting PT eval due to falls.  Daughter understands and states that she has contacted patient's hospice provder and they will contact their SW to assist with placement. Updated EDP.

## 2022-03-01 MED ORDER — AMLODIPINE BESYLATE 5 MG PO TABS
5.0000 mg | ORAL_TABLET | Freq: Once | ORAL | Status: AC
  Administered 2022-03-01: 5 mg via ORAL
  Filled 2022-03-01: qty 1

## 2022-03-01 NOTE — ED Notes (Signed)
Patient is sleeping at present 

## 2022-03-01 NOTE — Progress Notes (Signed)
[facility] with [facility] to see if [facility] is service [facility]

## 2022-03-01 NOTE — ED Provider Notes (Signed)
Alerted by nursing staff that blood pressures have been trending upwards.  Reviewed his prior records, they do make note of hypertension, but he is not on any meds per his MAR.  He does have a history of ischemic heart disease.  Considered beta-blocker but his heart rate is in the low 60s.  Given a dose of amlodipine and will monitor.   Gilda Crease, MD 03/01/22 [phone]

## 2022-03-01 NOTE — Progress Notes (Signed)
[facility] Hospital Liaison Note  This is a current [facility] hospice patient. Please call when patient is ready for discharge.   Please call with any questions or concerns. Thank you.   Lynder Parents [facility] Liaison [phone]

## 2022-03-01 NOTE — Discharge Instructions (Signed)
If he develops confusion, headache, fever, vomiting, chest pain, or any other new/concerning symptoms then return to the ER for evaluation.

## 2022-03-01 NOTE — Evaluation (Signed)
Physical Therapy Evaluation Patient Details Name: Jose Gilbert MRN: [id number] DOB: Jun 10, 1928 Today's Date: 03/01/2022  History of Present Illness  Pt is a 88 y/o male found down after wandering out of his ALF. PMH includes dementia, CKD, HTN, and parkinson's disease.  Clinical Impression  Pt admitted secondary to problem above with deficits below. Pt requiring min to min guard A for mobility tasks this session. Pleasantly confused throughout, but tolerated mobility well. Feel pt would benefit from use of RW and PT follow up at ALF if eligible. Will continue to follow acutely.        Recommendations for follow up therapy are one component of a multi-disciplinary discharge planning process, led by the attending physician.  Recommendations may be updated based on patient status, additional functional criteria and insurance authorization.  Follow Up Recommendations Other (comment) (PT follow up at [name])      Assistance Recommended at Discharge Frequent or constant Supervision/Assistance  Patient can return home with the following  A little help with bathing/dressing/bathroom;Assistance with cooking/housework;A little help with walking and/or transfers;Help with stairs or ramp for entrance;Assist for transportation;Direct supervision/assist for medications management    Equipment Recommendations Rolling walker (2 wheels) (if he does not have one already)  Recommendations for Other Services       Functional Status Assessment Patient has had a recent decline in their functional status and demonstrates the ability to make significant improvements in function in a reasonable and predictable amount of time.     Precautions / Restrictions Precautions Precautions: Fall Restrictions Weight Bearing Restrictions: No      Mobility  Bed Mobility Overal bed mobility: Needs Assistance Bed Mobility: Supine to Sit, Sit to Supine     Supine to sit: Supervision Sit to supine: Supervision    General bed mobility comments: Supervision for safety.    Transfers Overall transfer level: Needs assistance Equipment used: 1 person hand held assist Transfers: Sit to/from Stand Sit to Stand: Min assist           General transfer comment: Min A for steadying to stand from higher stretcher.    Ambulation/Gait Ambulation/Gait assistance: Min guard Gait Distance (Feet): 120 Feet Assistive device: 1 person hand held assist Gait Pattern/deviations: Step-through pattern, Decreased stride length Gait velocity: Decreased     General Gait Details: Min guard for safety. Pt holding to PT hand for support. No overt LOB noted.  Stairs            Wheelchair Mobility    Modified Rankin (Stroke Patients Only)       Balance Overall balance assessment: Needs assistance Sitting-balance support: No upper extremity supported, Feet supported Sitting balance-Leahy Scale: Fair     Standing balance support: Single extremity supported Standing balance-Leahy Scale: Poor Standing balance comment: Reliant on UE support                             Pertinent Vitals/Pain Pain Assessment Pain Assessment: No/denies pain    Home Living Family/patient expects to be discharged to:: Assisted living                 Home Equipment: Agricultural consultant (2 wheels);Cane - single point Additional Comments: Per notes, from [facility]    Prior Function Prior Level of Function : Needs assist             Mobility Comments: Per pt, he was using a RW, but unsure of accuracy ADLs Comments:  Per previous notes, occasional assist for ADLs.     Hand Dominance        Extremity/Trunk Assessment   Upper Extremity Assessment Upper Extremity Assessment: Generalized weakness    Lower Extremity Assessment Lower Extremity Assessment: Generalized weakness    Cervical / Trunk Assessment Cervical / Trunk Assessment: Kyphotic  Communication   Communication: HOH  Cognition  Arousal/Alertness: Awake/alert Behavior During Therapy: WFL for tasks assessed/performed Overall Cognitive Status: History of cognitive impairments - at baseline                                 General Comments: Dementia at baseline        General Comments General comments (skin integrity, edema, etc.): No family present    Exercises     Assessment/Plan    PT Assessment Patient needs continued PT services  PT Problem List Decreased strength;Decreased activity tolerance;Decreased balance;Decreased mobility;Decreased cognition;Decreased safety awareness;Decreased knowledge of use of DME       PT Treatment Interventions DME instruction;Gait training;Therapeutic activities;Functional mobility training;Therapeutic exercise;Balance training;Patient/family education    PT Goals (Current goals can be found in the Care Plan section)  Acute Rehab PT Goals PT Goal Formulation: Patient unable to participate in goal setting Time For Goal Achievement: 03/15/22 Potential to Achieve Goals: Good    Frequency Min 3X/week     Co-evaluation               AM-PAC PT "6 Clicks" Mobility  Outcome Measure Help needed turning from your back to your side while in a flat bed without using bedrails?: None Help needed moving from lying on your back to sitting on the side of a flat bed without using bedrails?: A Little Help needed moving to and from a bed to a chair (including a wheelchair)?: A Little Help needed standing up from a chair using your arms (e.g., wheelchair or bedside chair)?: A Little Help needed to walk in hospital room?: A Little Help needed climbing 3-5 steps with a railing? : A Lot 6 Click Score: 18    End of Session Equipment Utilized During Treatment: Gait belt Activity Tolerance: Patient tolerated treatment well Patient left: in bed;Other (comment) (on stretcher in ED hallway with nurse in view) Nurse Communication: Mobility status PT Visit Diagnosis:  Unsteadiness on feet (R26.81);Muscle weakness (generalized) (M62.81);History of falling (Z91.81)    Time: 1013-1030 PT Time Calculation (min) (ACUTE ONLY): 17 min   Charges:   PT Evaluation $PT Eval Moderate Complexity: 1 Mod          Farley Ly, PT, DPT  Acute Rehabilitation Services  Office: [phone]   Lehman Prom 03/01/2022, 10:49 AM

## 2022-03-01 NOTE — Progress Notes (Signed)
CSW spoke with patients social worker Delila Pereyra at the [place], [phone] [phone]. CSW was informed that patient is not services [facility] with [facility], which means SNF will not be covered. Bertina told CSW if SNF is recommended that CSW could send a referral but [facility] most likely will not pay for it. PT recommended patient discharge back to [facility] and follow-up with home health. CSW spoke with patients daughter Rinaldo Cloud who was in agreement and she will be coming to pick up her father today.

## 2022-03-01 NOTE — ED Provider Notes (Signed)
Patient discussed with with Child psychotherapist.  No indication for placement from our perspective and he has the ability to move up into memory care based on what I am discussing with his daughter.  From a blood pressure perspective it has dramatically improved and she states that he always gets hypertensive during these "episodes".  At this point he appears medically cleared for discharge to go back to his facility.  Daughter is taking him home.   Pricilla Loveless, MD 03/01/22 1227

## 2022-03-01 NOTE — Progress Notes (Signed)
Pending PT consult  

## 2022-03-14 ENCOUNTER — Emergency Department (HOSPITAL_COMMUNITY)
Admission: EM | Admit: 2022-03-14 | Discharge: 2022-03-15 | Disposition: A | Discharge status: Home / Self Care | Payer: No Typology Code available for payment source | Attending: Emergency Medicine | Admitting: Emergency Medicine

## 2022-03-14 ENCOUNTER — Encounter (HOSPITAL_COMMUNITY): Payer: Self-pay

## 2022-03-14 ENCOUNTER — Emergency Department (HOSPITAL_COMMUNITY): Payer: No Typology Code available for payment source

## 2022-03-14 ENCOUNTER — Other Ambulatory Visit: Payer: Self-pay

## 2022-03-14 DIAGNOSIS — I10 Essential (primary) hypertension: Secondary | ICD-10-CM | POA: Insufficient documentation

## 2022-03-14 DIAGNOSIS — W01198A Fall on same level from slipping, tripping and stumbling with subsequent striking against other object, initial encounter: Secondary | ICD-10-CM | POA: Insufficient documentation

## 2022-03-14 DIAGNOSIS — S80211A Abrasion, right knee, initial encounter: Secondary | ICD-10-CM | POA: Diagnosis not present

## 2022-03-14 DIAGNOSIS — S8991XA Unspecified injury of right lower leg, initial encounter: Secondary | ICD-10-CM | POA: Diagnosis present

## 2022-03-14 DIAGNOSIS — Z7982 Long term (current) use of aspirin: Secondary | ICD-10-CM | POA: Diagnosis not present

## 2022-03-14 DIAGNOSIS — G20 Parkinson's disease: Secondary | ICD-10-CM | POA: Insufficient documentation

## 2022-03-14 DIAGNOSIS — Y9301 Activity, walking, marching and hiking: Secondary | ICD-10-CM | POA: Insufficient documentation

## 2022-03-14 DIAGNOSIS — I2510 Atherosclerotic heart disease of native coronary artery without angina pectoris: Secondary | ICD-10-CM | POA: Insufficient documentation

## 2022-03-14 DIAGNOSIS — R0781 Pleurodynia: Secondary | ICD-10-CM | POA: Insufficient documentation

## 2022-03-14 DIAGNOSIS — Y92129 Unspecified place in nursing home as the place of occurrence of the external cause: Secondary | ICD-10-CM | POA: Insufficient documentation

## 2022-03-14 DIAGNOSIS — S0990XA Unspecified injury of head, initial encounter: Secondary | ICD-10-CM | POA: Insufficient documentation

## 2022-03-14 DIAGNOSIS — W19XXXA Unspecified fall, initial encounter: Secondary | ICD-10-CM

## 2022-03-14 NOTE — ED Notes (Signed)
2130. Pt facility called. Messages left.  2217: facility not answering. Not calling back. [facility] will be contacted for pt transfer

## 2022-03-14 NOTE — ED Triage Notes (Signed)
Arrives EMS from [facility] after 2 falls today.   First one mechanical after tripping on pants. Second fall unwitnessed pt was found on the ground in his room. Uses walker. C/o hitting head. No blood thinners on MAR .  Hospice for unknown reason per facility.

## 2022-03-14 NOTE — ED Notes (Signed)
Pt not oriented to place, time, but communicates needs and follows commamds

## 2022-03-14 NOTE — Discharge Instructions (Signed)
Today you were seen in the emergency department for your fall.    In the emergency department you had a CT scan of your head, C-spine, and x-rays of your chest and knee which did not show any injuries.    At home, please use caution while walking.    Follow-up with your primary doctor in 2-3 days regarding your visit.    Return immediately to the emergency department if you experience any of the following: Severe headache vomiting, knee pain, or any other concerning symptoms.    Thank you for visiting our Emergency Department. It was a pleasure taking care of you today.

## 2022-03-15 NOTE — ED Provider Notes (Signed)
[facility]-EMERGENCY DEPT Provider Note   CSN: [id number] Arrival date & time: 03/14/22  1929     History  Chief Complaint  Patient presents with   Daveyon Kitchings is a 88 y.o. male.  88 yo M with hx of CAD and parkinson's who presented to the ED from his facility after 2 falls. History obtained per patient's daughter who states that she was called by the nursing home after he had a fall and was going to be sent to the ER. Says that he has been sleeping more deeply and has been less stable on his feet recently 2/2 his parkinson's. Says that facility staff got him out of bed and attempted to walk him earlier but that he fell during this attempt. Says that she was notified that he had a second fall that was unwitnessed and he was brought into the ED. Per EMS, first fall was 2/2 him tripping over his pants while getting dressed.   Patient was able to provide additional details about his second fall. States that he was walking and was moving too fast and tripped and fell forward onto his knees also striking his anterior chest and head. He is having pain along his ribs and right knee at this time. Ambulates with walker.   Denied any preceding CP, SOB, light headedness. No recent illness, dysuria, frequency, cough, n/v/d.      Fall   Past Medical History:  Diagnosis Date   CAD (coronary artery disease) 12/31/2021   Hypertension    Parkinson's disease (HCC)         Home Medications Prior to Admission medications   Medication Sig Start Date End Date Taking? Authorizing Provider  acetaminophen (TYLENOL) 325 MG tablet Take 650 mg by mouth every 8 (eight) hours as needed for mild pain.    [provider]  aspirin EC 81 MG tablet Take 81 mg by mouth at bedtime. 06/06/14   [provider]  carbidopa-levodopa (SINEMET IR) 25-100 MG tablet Take 2 tablets by mouth See admin instructions. 2 tablets at 0900, 2 tablets at 1700, and 2 tablets at  2100 12/19/21   [provider]  Cranberry 200 MG CAPS Take 200 mg by mouth daily.    [provider]  docusate sodium (COLACE) 100 MG capsule Take 100 mg by mouth daily.    [provider]  loperamide (IMODIUM A-D) 2 MG tablet Take 2 mg by mouth every 4 (four) hours as needed for diarrhea or loose stools.    [provider]  nitroGLYCERIN (NITROSTAT) 0.4 MG SL tablet Place 0.4 mg under the tongue See admin instructions. Every 15 minutes as needed for chest pain 10/16/21   [provider]  omeprazole (PRILOSEC) 20 MG capsule Take 20 mg by mouth daily. 12/16/21   [provider]  polyethylene glycol (MIRALAX / GLYCOLAX) 17 g packet Take 17 g by mouth every 6 (six) hours as needed for mild constipation.    [provider]  senna-docusate (SENOKOT-S) 8.6-50 MG tablet Take 1 tablet by mouth at bedtime.    [provider]  simvastatin (ZOCOR) 80 MG tablet Take 40 mg by mouth at bedtime. 12/18/21   [provider]  Suvorexant (BELSOMRA) 5 MG TABS Take 5 mg by mouth at bedtime.    [provider]  venlafaxine XR (EFFEXOR-XR) 75 MG 24 hr capsule Take 225 mg by mouth at bedtime. 10/23/21   [provider]  Zinc Oxide (  BOUDREAUXS BUTT PASTE) 16 % OINT Apply 1 Application topically 3 (three) times daily. Needs to be placed in the buttocks area where there is skin breakdown 3 times a day 01/25/22   Gwyneth Sprout, MD      Allergies    Benadryl [diphenhydramine], Imdur [isosorbide nitrate], Iodinated contrast media, Mirtazapine, Penicillins, Shellfish allergy, Tramadol, and Trazodone    Review of Systems   Review of Systems  Physical Exam Updated Vital Signs BP (!) 149/128   Pulse 78   Temp 97.8 F (36.6 C) (Oral)   Resp (!) 22   Ht 5\' 8"  (1.727 m)   Wt 63.5 kg   SpO2 97%   BMI 21.29 kg/m  Physical Exam Vitals and nursing note reviewed.  Constitutional:      General: He is not in acute distress.     Appearance: He is well-developed. He is not ill-appearing.  HENT:     Head: Normocephalic and atraumatic.     Right Ear: External ear normal.     Left Ear: External ear normal.     Nose: Nose normal.     Mouth/Throat:     Mouth: Mucous membranes are moist.     Pharynx: Oropharynx is clear.  Eyes:     Extraocular Movements: Extraocular movements intact.     Conjunctiva/sclera: Conjunctivae normal.     Pupils: Pupils are equal, round, and reactive to light.  Neck:     Comments: No c-spine midline ttp or stepoffs noted. Patient able to range neck from left to right and touch chin to chest without pain.  Cardiovascular:     Rate and Rhythm: Normal rate and regular rhythm.     Pulses: Normal pulses.     Heart sounds: Normal heart sounds.  Pulmonary:     Effort: Pulmonary effort is normal. No respiratory distress.     Breath sounds: Normal breath sounds.  Abdominal:     General: There is no distension.     Palpations: Abdomen is soft. There is no mass.     Tenderness: There is no abdominal tenderness. There is no guarding.  Musculoskeletal:        General: No swelling or deformity.     Cervical back: Normal range of motion and neck supple.     Right lower leg: No edema.     Left lower leg: No edema.     Comments: Abrasion of R knee. Mild ttp over R patella. No other msk ttp over shuolders, elbows, wrists (including snuff box), hips, L knee, or BL ankles.   Skin:    General: Skin is warm and dry.     Capillary Refill: Capillary refill takes less than 2 seconds.  Neurological:     General: No focal deficit present.     Mental Status: He is alert. Mental status is at baseline.     Cranial Nerves: No cranial nerve deficit.     Sensory: No sensory deficit (intact to LT in all 4 extremities).     Motor: No weakness (5/5 in all 4 extremities).     Comments: Alert and oriented to person, place, and year but was unsure of month  Psychiatric:        Mood and Affect: Mood normal.         Behavior: Behavior normal.     ED Results / Procedures / Treatments   Labs (all labs ordered are listed, but only abnormal results are displayed) Labs Reviewed - No data to display  EKG  EKG Interpretation  Date/Time:  Sunday March 14 2022 19:40:58 EDT Ventricular Rate:  68 PR Interval:  257 QRS Duration: 99 QT Interval:  428 QTC Calculation: 456 R Axis:   -39 Text Interpretation: Sinus rhythm Prolonged PR interval Left axis deviation RSR' in V1 or V2, right VCD or RVH Confirmed by Eber Hong ([id number]) on 03/15/2022 12:23:00 PM  Radiology CT Head Wo Contrast  Result Date: 03/14/2022 CLINICAL DATA:  Two falls today. EXAM: CT HEAD WITHOUT CONTRAST TECHNIQUE: Contiguous axial images were obtained from the base of the skull through the vertex without intravenous contrast. RADIATION DOSE REDUCTION: This exam was performed according to the departmental dose-optimization program which includes automated exposure control, adjustment of the mA and/or kV according to patient size and/or use of iterative reconstruction technique. COMPARISON:  CT head 02/28/2022 and 01/13/2022. FINDINGS: Brain: No evidence of acute infarction, hemorrhage, hydrocephalus, extra-axial collection or mass lesion/mass effect. Generalized parenchymal volume loss with associated enlargement of the ventricles. Hypodensities in the subcortical and periventricular white matter, consistent with chronic small-vessel ischemic changes. 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 Vascular: No hyperdense vessel or unexpected calcification. Skull: Normal. Negative for fracture or focal lesion. Sinuses/Orbits: No acute finding. Visualized paranasal sinuses and mastoid air cells are clear. Other: None. IMPRESSION: No acute intracranial abnormality. Electronically Signed   By: Sherron Ales M.D.   On: 03/14/2022 21:00   CT Cervical Spine Wo Contrast  Result Date: 03/14/2022 CLINICAL DATA:  Trauma. EXAM: CT  CERVICAL SPINE WITHOUT CONTRAST TECHNIQUE: Multidetector CT imaging of the cervical spine was performed without intravenous contrast. Multiplanar CT image reconstructions were also generated. RADIATION DOSE REDUCTION: This exam was performed according to the departmental dose-optimization program which includes automated exposure control, adjustment of the mA and/or kV according to patient size and/or use of iterative reconstruction technique. COMPARISON:  Cervical spine CT 02/28/2022 FINDINGS: Alignment: Normal. Skull base and vertebrae: No acute fracture. No primary bone lesion or focal pathologic process. Soft tissues and spinal canal: No prevertebral fluid or swelling. No visible canal hematoma. Disc levels: There is disc space narrowing and endplate osteophyte formation throughout the cervical spine compatible with degenerative change. Degenerative facet joint changes are also seen bilaterally. There is mild multilevel neural foraminal stenosis similar to the prior study. There is no severe central canal stenosis at any level. Upper chest: Negative. Other: None. IMPRESSION: No acute fracture or traumatic subluxation of the cervical spine. Electronically Signed   By: Darliss Cheney M.D.   On: 03/14/2022 20:54   DG Chest 2 View  Result Date: 03/14/2022 CLINICAL DATA:  Mechanical fall, rule out rib fracture. EXAM: CHEST - 2 VIEW COMPARISON:  Chest radiograph 12/30/2021. FINDINGS: The heart size and mediastinal contours are within normal limits. Postoperative changes of median sternotomy. Both lungs are clear. No pleural effusion or pneumothorax. No acute osseous abnormality. Multilevel degenerative disc disease throughout the thoracic spine. IMPRESSION: No acute cardiopulmonary abnormality. No acute displaced rib fracture as queried. Electronically Signed   By: Sherron Ales M.D.   On: 03/14/2022 20:53   DG Knee 2 Views Right  Result Date: 03/14/2022 CLINICAL DATA:  Knee pain after fall. EXAM: RIGHT KNEE -  1-2 VIEW COMPARISON:  None Available. FINDINGS: There is no acute fracture or dislocation. There is no joint effusion. There is prepatellar soft tissue swelling. Enthesophyte is noted from the superior patellar pole. There is mild degenerative narrowing of the medial compartment. Peripheral vascular calcifications are present. IMPRESSION: 1. No acute fracture or  dislocation. 2. Prepatellar soft tissue swelling. 3. Mild degenerative changes of the medial compartment. Electronically Signed   By: Ronney Asters M.D.   On: 03/14/2022 20:51    Procedures Procedures    Medications Ordered in ED Medications - No data to display  ED Course/ Medical Decision Making/ A&P                           Medical Decision Making 88 yo M with hx of parkinson's and CAD who presented with mechanical fall x2. Pt did hit head and was on asa to ct head/c-spine were obtained that did not show acute injury. With pt's report of hitting his chest and knee plain films of these locations were also obtained which did not reveal injury. After discussion with the pt and his daughter it appears that the fall was mechanical in nature rather than 2/2 syncope, stroke, or seizure. Attempted to call SNF multiple times to update them and gain additional hx to see if they had additional concerns but they did not respond. Pt was then transported back to his facility in stable condition.   Amount and/or Complexity of Data Reviewed Radiology: ordered.   Final Clinical Impression(s) / ED Diagnoses Final diagnoses:  Fall, initial encounter  Abrasion of right knee, initial encounter    Rx / DC Orders ED Discharge Orders     None         Fransico Meadow, MD 03/15/22 1332

## 2022-09-09 ENCOUNTER — Emergency Department (HOSPITAL_COMMUNITY): Payer: No Typology Code available for payment source

## 2022-09-09 ENCOUNTER — Other Ambulatory Visit: Payer: Self-pay

## 2022-09-09 ENCOUNTER — Encounter (HOSPITAL_COMMUNITY): Payer: Self-pay | Admitting: *Deleted

## 2022-09-09 ENCOUNTER — Emergency Department (HOSPITAL_COMMUNITY)
Admission: EM | Admit: 2022-09-09 | Discharge: 2022-09-09 | Disposition: A | Discharge status: Skilled Nursing Facility | Payer: No Typology Code available for payment source | Attending: Emergency Medicine | Admitting: Emergency Medicine

## 2022-09-09 DIAGNOSIS — S0990XA Unspecified injury of head, initial encounter: Secondary | ICD-10-CM

## 2022-09-09 DIAGNOSIS — F0390 Unspecified dementia without behavioral disturbance: Secondary | ICD-10-CM | POA: Diagnosis not present

## 2022-09-09 DIAGNOSIS — G20C Parkinsonism, unspecified: Secondary | ICD-10-CM | POA: Diagnosis not present

## 2022-09-09 DIAGNOSIS — S0121XA Laceration without foreign body of nose, initial encounter: Secondary | ICD-10-CM | POA: Diagnosis not present

## 2022-09-09 DIAGNOSIS — W19XXXA Unspecified fall, initial encounter: Secondary | ICD-10-CM | POA: Insufficient documentation

## 2022-09-09 DIAGNOSIS — S022XXA Fracture of nasal bones, initial encounter for closed fracture: Secondary | ICD-10-CM | POA: Insufficient documentation

## 2022-09-09 DIAGNOSIS — M25511 Pain in right shoulder: Secondary | ICD-10-CM | POA: Insufficient documentation

## 2022-09-09 DIAGNOSIS — S0181XA Laceration without foreign body of other part of head, initial encounter: Secondary | ICD-10-CM | POA: Diagnosis not present

## 2022-09-09 DIAGNOSIS — R0781 Pleurodynia: Secondary | ICD-10-CM | POA: Diagnosis not present

## 2022-09-09 DIAGNOSIS — S0993XA Unspecified injury of face, initial encounter: Secondary | ICD-10-CM | POA: Diagnosis present

## 2022-09-09 HISTORY — DX: Major depressive disorder, single episode, unspecified: F32.9

## 2022-09-09 HISTORY — DX: Hyperlipidemia, unspecified: E78.5

## 2022-09-09 HISTORY — DX: Atherosclerotic heart disease of native coronary artery without angina pectoris: I25.10

## 2022-09-09 HISTORY — DX: Insomnia, unspecified: G47.00

## 2022-09-09 HISTORY — DX: History of falling: Z91.81

## 2022-09-09 HISTORY — DX: Chronic kidney disease, unspecified: N18.9

## 2022-09-09 HISTORY — DX: Peripheral vascular disease, unspecified: I73.9

## 2022-09-09 HISTORY — DX: Constipation, unspecified: K59.00

## 2022-09-09 HISTORY — DX: Gastro-esophageal reflux disease without esophagitis: K21.9

## 2022-09-09 HISTORY — DX: Unspecified dementia, unspecified severity, without behavioral disturbance, psychotic disturbance, mood disturbance, and anxiety: F03.90

## 2022-09-09 LAB — CBC
HCT: 42.6 % (ref 39.0–52.0)
Hemoglobin: 14.1 g/dL (ref 13.0–17.0)
MCH: 30.4 pg (ref 26.0–34.0)
MCHC: 33.1 g/dL (ref 30.0–36.0)
MCV: 91.8 fL (ref 80.0–100.0)
Platelets: 146 10*3/uL — ABNORMAL LOW (ref 150–400)
RBC: 4.64 MIL/uL (ref 4.22–5.81)
RDW: 13.6 % (ref 11.5–15.5)
WBC: 5.8 10*3/uL (ref 4.0–10.5)
nRBC: 0 % (ref 0.0–0.2)

## 2022-09-09 LAB — BASIC METABOLIC PANEL
Anion gap: 9 (ref 5–15)
BUN: 27 mg/dL — ABNORMAL HIGH (ref 8–23)
CO2: 23 mmol/L (ref 22–32)
Calcium: 8.6 mg/dL — ABNORMAL LOW (ref 8.9–10.3)
Chloride: 107 mmol/L (ref 98–111)
Creatinine, Ser: 1.54 mg/dL — ABNORMAL HIGH (ref 0.61–1.24)
GFR, Estimated: 42 mL/min — ABNORMAL LOW (ref >60)
Glucose, Bld: 123 mg/dL — ABNORMAL HIGH (ref 70–99)
Potassium: 4.2 mmol/L (ref 3.5–5.1)
Sodium: 139 mmol/L (ref 135–145)

## 2022-09-09 LAB — PROTIME-INR
INR: 1.1 (ref 0.8–1.2)
Prothrombin Time: 13.8 seconds (ref 11.4–15.2)

## 2022-09-09 LAB — TYPE AND SCREEN
ABO/RH(D): O POS
Antibody Screen: NEGATIVE

## 2022-09-09 MED ORDER — LIDOCAINE-EPINEPHRINE (PF) 2 %-1:200000 IJ SOLN
20.0000 mL | Freq: Once | INTRAMUSCULAR | Status: AC
  Administered 2022-09-09: 20 mL via INTRADERMAL
  Filled 2022-09-09: qty 20

## 2022-09-09 NOTE — Discharge Instructions (Signed)
Thankfully the CT scans do not show any signs of brain injury or spine injury however they do show that there has been a nasal bone injury.  This will heal by itself and there is nothing that you need to do, there may be some nosebleeds over the next couple of days, do not be concerned about this.  There are stitches in the forehead and in the nose that will need to be removed within about 10 days.  You can have the nurse practitioner or physician take care of this at the facility.  Return to the emergency department for severe or worsening symptoms

## 2022-09-09 NOTE — ED Provider Notes (Signed)
Jose Gilbert Provider Note   CSN: [id number] Arrival date & time: 09/09/22  0830     History  Chief Complaint  Patient presents with   Jose Gilbert is a 89 y.o. male.   Fall   This patient is a 89 year old male, he currently takes a baby aspirin, he has known Parkinson's and dementia, he is not on any other anticoagulants.  He comes from a nursing facility at [facility] after he had a witnessed fall while he was walking with his walker, lost his balance and fell striking his face on the ground.  The patient complains of shoulder pain on the right, bilateral rib pain, he had complained of leg pain or hip pain prior to arrival but not at this time.  He also states he feels like he has to blow his nose and has visible swelling and deformity of his nose with blood in his nostrils.  There is no visible loss of consciousness, the patient was transported by paramedics without a cervical collar, no signs of decompensation neurologically.  No seizures, no vomiting.    Home Medications Prior to Admission medications   Medication Sig Start Date End Date Taking? Authorizing Provider  acetaminophen (TYLENOL) 325 MG tablet Take 650 mg by mouth every 8 (eight) hours as needed for mild pain.   Yes [provider]  carbidopa-levodopa (SINEMET IR) 25-100 MG tablet Take 2 tablets by mouth See admin instructions. 2 tablets at 0900, 2 tablets at 1700, and 2 tablets at 2100 12/19/21  Yes [provider]  nitroGLYCERIN (NITROSTAT) 0.4 MG SL tablet Place 0.4 mg under the tongue See admin instructions. Every 15 minutes as needed for chest pain 10/16/21  Yes [provider]  polyethylene glycol (MIRALAX / GLYCOLAX) 17 g packet Take 17 g by mouth every 6 (six) hours as needed for mild constipation.   Yes [provider]  senna-docusate (SENOKOT-S) 8.6-50 MG tablet Take 1 tablet by mouth at bedtime.   Yes [provider]  simvastatin (ZOCOR) 80 MG tablet Take 40 mg by mouth at bedtime. 12/18/21  Yes [provider]  venlafaxine XR (EFFEXOR-XR) 75 MG 24 hr capsule Take 225 mg by mouth at bedtime. 10/23/21  Yes [provider]  Zinc Oxide (BOUDREAUXS BUTT PASTE) 16 % OINT Apply 1 Application topically 3 (three) times daily. Needs to be placed in the buttocks area where there is skin breakdown 3 times a day 01/25/22  Yes Blanchie Dessert, MD      Allergies    Benadryl [diphenhydramine], Imdur [isosorbide nitrate], Iodinated contrast media, Mirtazapine, Penicillins, Shellfish allergy, Tramadol, and Trazodone    Review of Systems   Review of Systems  Unable to perform ROS: Dementia    Physical Exam Updated Vital Signs BP (!) 195/95   Pulse 75   Resp 16   Ht 1.727 m (5\' 8" )   Wt 63.5 kg   SpO2 100%   BMI 21.29 kg/m  Physical Exam Vitals and nursing note reviewed.  Constitutional:      General: He is not in acute distress.    Appearance: He is well-developed.  HENT:     Head: Normocephalic.     Comments: Last patient mid forehead between eyebrows as well as the bridge of the nose, swelling over the nose, no malocclusion, there is blood in the bilateral naris, no active bleeding    Mouth/Throat:     Mouth: Mucous membranes  are moist.     Pharynx: No oropharyngeal exudate.  Eyes:     General: No scleral icterus.       Right eye: No discharge.        Left eye: No discharge.     Conjunctiva/sclera: Conjunctivae normal.     Pupils: Pupils are equal, round, and reactive to light.  Neck:     Thyroid: No thyromegaly.     Vascular: No JVD.     Comments: Placed in cervical collar on arrival, no tenderness over the cervical spine Cardiovascular:     Rate and Rhythm: Normal rate and regular rhythm.     Heart sounds: Normal heart sounds. No murmur heard.    No friction rub. No gallop.  Pulmonary:     Effort: Pulmonary effort is normal. No respiratory distress.     Breath sounds:  Normal breath sounds. No wheezing or rales.  Abdominal:     General: Bowel sounds are normal. There is no distension.     Palpations: Abdomen is soft. There is no mass.     Tenderness: There is no abdominal tenderness.  Musculoskeletal:        General: Swelling and tenderness present.     Right lower leg: No edema.     Left lower leg: No edema.     Comments: Mild tenderness over the right proximal humerus and shoulder but no obvious deformity.  Good range of motion of the bilateral hips at the knees and the hips, no leg length discrepancies, tenderness over the bilateral ribs without crepitance or subcutaneous emphysema  Lymphadenopathy:     Cervical: No cervical adenopathy.  Skin:    General: Skin is warm and dry.     Findings: No erythema or rash.     Comments: Lacerations as noted above  Neurological:     General: No focal deficit present.     Mental Status: He is alert.     Coordination: Coordination normal.     Comments: The patient is quiet but able to follow commands, he is generally weak but in no apparent distress.  He does not have any focal weakness, facial droop or lateralizing weakness of the arms or legs.  He has some difficulty with cognitive issues and cannot give me a very clear history  Psychiatric:        Behavior: Behavior normal.     ED Results / Procedures / Treatments   Labs (all labs ordered are listed, but only abnormal results are displayed) Labs Reviewed  CBC - Abnormal; Notable for the following components:      Result Value   Platelets 146 (*)    All other components within normal limits  BASIC METABOLIC PANEL - Abnormal; Notable for the following components:   Glucose, Bld 123 (*)    BUN 27 (*)    Creatinine, Ser 1.54 (*)    Calcium 8.6 (*)    GFR, Estimated 42 (*)    All other components within normal limits  PROTIME-INR  TYPE AND SCREEN    EKG None  Radiology DG HIP UNILAT WITH PELVIS 2-3 VIEWS LEFT  Result Date: 09/09/2022 CLINICAL  DATA:  Left hip pain. EXAM: DG HIP (WITH OR WITHOUT PELVIS) 2-3V LEFT COMPARISON:  Left hip radiographs 02/28/2022. FINDINGS: Three views. No left hip fracture or dislocation. 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 IMPRESSION: No left hip fracture. 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 Electronically Signed   By: Lyndal Rainbow.D.  On: 09/09/2022 11:52   DG Ribs Bilateral  Result Date: 09/09/2022 CLINICAL DATA:  Trauma.  Fall. EXAM: BILATERAL RIBS - 3+ VIEW; PORTABLE CHEST - 1 VIEW COMPARISON:  Chest radiographs 03/14/2022. FINDINGS: Single view of the chest.  Four views of the ribs. Low lung volumes without focal airspace opacity. Median sternotomy wires. Stable cardiac and mediastinal contours with atherosclerotic calcifications of the aortic arch and tortuosity of the descending thoracic aorta. No pleural effusion or pneumothorax. No displaced rib fracture. IMPRESSION: 1. Low lung volumes without evidence of acute cardiopulmonary disease. 2. No displaced rib fracture. Electronically Signed   By: Orvan Falconer M.D.   On: 09/09/2022 10:11   DG Chest Port 1 View  Result Date: 09/09/2022 CLINICAL DATA:  Trauma.  Fall. EXAM: BILATERAL RIBS - 3+ VIEW; PORTABLE CHEST - 1 VIEW COMPARISON:  Chest radiographs 03/14/2022. FINDINGS: Single view of the chest.  Four views of the ribs. Low lung volumes without focal airspace opacity. Median sternotomy wires. Stable cardiac and mediastinal contours with atherosclerotic calcifications of the aortic arch and tortuosity of the descending thoracic aorta. No pleural effusion or pneumothorax. No displaced rib fracture. IMPRESSION: 1. Low lung volumes without evidence of acute cardiopulmonary disease. 2. No displaced rib fracture. Electronically Signed   By: Orvan Falconer M.D.   On: 09/09/2022 10:11   DG Humerus Left  Result Date: 09/09/2022 CLINICAL DATA:  Fall. EXAM: LEFT HUMERUS - 2+ VIEW COMPARISON:  None Available. FINDINGS:  There is no evidence of fracture or other focal bone lesions. Soft tissues are unremarkable. IMPRESSION: Negative. Electronically Signed   By: Lupita Raider M.D.   On: 09/09/2022 10:10   DG Pelvis Portable  Result Date: 09/09/2022 CLINICAL DATA:  Fall today. EXAM: PORTABLE PELVIS 1-2 VIEWS COMPARISON:  None Available. FINDINGS: There is no evidence of pelvic fracture or diastasis. No pelvic bone lesions are seen. IMPRESSION: Negative. Electronically Signed   By: Lupita Raider M.D.   On: 09/09/2022 10:08   DG Shoulder Left  Result Date: 09/09/2022 CLINICAL DATA:  Trauma, fell today EXAM: LEFT SHOULDER - 2+ VIEW COMPARISON:  None Available. FINDINGS: Osseous demineralization. AC joint alignment normal. Visualized ribs intact. No acute fracture, dislocation, or bone destruction. IMPRESSION: No acute osseous abnormalities. Electronically Signed   By: Ulyses Southward M.D.   On: 09/09/2022 10:08   CT Head Wo Contrast  Result Date: 09/09/2022 CLINICAL DATA:  Facial trauma, blunt; Polytrauma, blunt EXAM: CT HEAD WITHOUT CONTRAST CT MAXILLOFACIAL WITHOUT CONTRAST CT CERVICAL SPINE WITHOUT CONTRAST TECHNIQUE: Multidetector CT imaging of the head, cervical spine, and maxillofacial structures were performed using the standard protocol without intravenous contrast. Multiplanar CT image reconstructions of the cervical spine and maxillofacial structures were also generated. RADIATION DOSE REDUCTION: This exam was performed according to the departmental dose-optimization program which includes automated exposure control, adjustment of the mA and/or kV according to patient size and/or use of iterative reconstruction technique. COMPARISON:  03/14/2022 FINDINGS: CT HEAD FINDINGS Brain: No evidence of acute infarction, hemorrhage, hydrocephalus, extra-axial collection or mass lesion/mass effect. Patchy low-density changes within the periventricular and subcortical white matter compatible with chronic microvascular ischemic  change. Mild diffuse cerebral volume loss. Vascular: Atherosclerotic calcifications involving the large vessels of the skull base. No unexpected hyperdense vessel. Skull: Normal. Negative for fracture or focal lesion. Other: None. CT MAXILLOFACIAL FINDINGS Osseous: Acute mildly displaced fractures of the bilateral nasal bones. There is also a nondisplaced fracture of the anterior aspect of the bony nasal septum (series 7,  image 36). No additional maxillofacial bone fracture. Bony orbital walls are intact. Mandible intact. Temporomandibular joints are aligned without dislocation. Orbits: Negative. No traumatic or inflammatory finding. Sinuses: Minimal mucosal thickening in the right maxillary sinus. No air-fluid levels. Soft tissues: Soft tissue swelling over the nose and forehead regions. CT CERVICAL SPINE FINDINGS Alignment: Facet joints are aligned without dislocation or traumatic listhesis. Dens and lateral masses are aligned. Unchanged mild grade 1 anterolisthesis at C4-5 and C5-6 secondary to degenerative facet arthropathy. Skull base and vertebrae: No acute fracture. No primary bone lesion or focal pathologic process. Soft tissues and spinal canal: No prevertebral fluid or swelling. No visible canal hematoma. Disc levels:  Advanced multilevel cervical spondylosis, unchanged. Upper chest: Negative. Other: None. IMPRESSION: 1. No acute intracranial abnormality. 2. Acute mildly displaced fractures of the bilateral nasal bones. 3. Nondisplaced fracture of the anterior aspect of the bony nasal septum. 4. No acute fracture or traumatic listhesis of the cervical spine. Electronically Signed   By: Davina Poke D.O.   On: 09/09/2022 09:48   CT Maxillofacial Wo Contrast  Result Date: 09/09/2022 CLINICAL DATA:  Facial trauma, blunt; Polytrauma, blunt EXAM: CT HEAD WITHOUT CONTRAST CT MAXILLOFACIAL WITHOUT CONTRAST CT CERVICAL SPINE WITHOUT CONTRAST TECHNIQUE: Multidetector CT imaging of the head, cervical spine,  and maxillofacial structures were performed using the standard protocol without intravenous contrast. Multiplanar CT image reconstructions of the cervical spine and maxillofacial structures were also generated. RADIATION DOSE REDUCTION: This exam was performed according to the departmental dose-optimization program which includes automated exposure control, adjustment of the mA and/or kV according to patient size and/or use of iterative reconstruction technique. COMPARISON:  03/14/2022 FINDINGS: CT HEAD FINDINGS Brain: No evidence of acute infarction, hemorrhage, hydrocephalus, extra-axial collection or mass lesion/mass effect. Patchy low-density changes within the periventricular and subcortical white matter compatible with chronic microvascular ischemic change. Mild diffuse cerebral volume loss. Vascular: Atherosclerotic calcifications involving the large vessels of the skull base. No unexpected hyperdense vessel. Skull: Normal. Negative for fracture or focal lesion. Other: None. CT MAXILLOFACIAL FINDINGS Osseous: Acute mildly displaced fractures of the bilateral nasal bones. There is also a nondisplaced fracture of the anterior aspect of the bony nasal septum (series 7, image 36). No additional maxillofacial bone fracture. Bony orbital walls are intact. Mandible intact. Temporomandibular joints are aligned without dislocation. Orbits: Negative. No traumatic or inflammatory finding. Sinuses: Minimal mucosal thickening in the right maxillary sinus. No air-fluid levels. Soft tissues: Soft tissue swelling over the nose and forehead regions. CT CERVICAL SPINE FINDINGS Alignment: Facet joints are aligned without dislocation or traumatic listhesis. Dens and lateral masses are aligned. Unchanged mild grade 1 anterolisthesis at C4-5 and C5-6 secondary to degenerative facet arthropathy. Skull base and vertebrae: No acute fracture. No primary bone lesion or focal pathologic process. Soft tissues and spinal canal: No  prevertebral fluid or swelling. No visible canal hematoma. Disc levels:  Advanced multilevel cervical spondylosis, unchanged. Upper chest: Negative. Other: None. IMPRESSION: 1. No acute intracranial abnormality. 2. Acute mildly displaced fractures of the bilateral nasal bones. 3. Nondisplaced fracture of the anterior aspect of the bony nasal septum. 4. No acute fracture or traumatic listhesis of the cervical spine. Electronically Signed   By: Davina Poke D.O.   On: 09/09/2022 09:48   CT Cervical Spine Wo Contrast  Result Date: 09/09/2022 CLINICAL DATA:  Facial trauma, blunt; Polytrauma, blunt EXAM: CT HEAD WITHOUT CONTRAST CT MAXILLOFACIAL WITHOUT CONTRAST CT CERVICAL SPINE WITHOUT CONTRAST TECHNIQUE: Multidetector CT imaging of the head, cervical spine,  and maxillofacial structures were performed using the standard protocol without intravenous contrast. Multiplanar CT image reconstructions of the cervical spine and maxillofacial structures were also generated. RADIATION DOSE REDUCTION: This exam was performed according to the departmental dose-optimization program which includes automated exposure control, adjustment of the mA and/or kV according to patient size and/or use of iterative reconstruction technique. COMPARISON:  03/14/2022 FINDINGS: CT HEAD FINDINGS Brain: No evidence of acute infarction, hemorrhage, hydrocephalus, extra-axial collection or mass lesion/mass effect. Patchy low-density changes within the periventricular and subcortical white matter compatible with chronic microvascular ischemic change. Mild diffuse cerebral volume loss. Vascular: Atherosclerotic calcifications involving the large vessels of the skull base. No unexpected hyperdense vessel. Skull: Normal. Negative for fracture or focal lesion. Other: None. CT MAXILLOFACIAL FINDINGS Osseous: Acute mildly displaced fractures of the bilateral nasal bones. There is also a nondisplaced fracture of the anterior aspect of the bony nasal  septum (series 7, image 36). No additional maxillofacial bone fracture. Bony orbital walls are intact. Mandible intact. Temporomandibular joints are aligned without dislocation. Orbits: Negative. No traumatic or inflammatory finding. Sinuses: Minimal mucosal thickening in the right maxillary sinus. No air-fluid levels. Soft tissues: Soft tissue swelling over the nose and forehead regions. CT CERVICAL SPINE FINDINGS Alignment: Facet joints are aligned without dislocation or traumatic listhesis. Dens and lateral masses are aligned. Unchanged mild grade 1 anterolisthesis at C4-5 and C5-6 secondary to degenerative facet arthropathy. Skull base and vertebrae: No acute fracture. No primary bone lesion or focal pathologic process. Soft tissues and spinal canal: No prevertebral fluid or swelling. No visible canal hematoma. Disc levels:  Advanced multilevel cervical spondylosis, unchanged. Upper chest: Negative. Other: None. IMPRESSION: 1. No acute intracranial abnormality. 2. Acute mildly displaced fractures of the bilateral nasal bones. 3. Nondisplaced fracture of the anterior aspect of the bony nasal septum. 4. No acute fracture or traumatic listhesis of the cervical spine. Electronically Signed   By: Davina Poke D.O.   On: 09/09/2022 09:48    Procedures Procedures    Medications Ordered in ED Medications  lidocaine-EPINEPHrine (XYLOCAINE W/EPI) 2 %-1:200000 (PF) injection 20 mL (20 mLs Intradermal Given by Other 09/09/22 1029)    ED Course/ Medical Decision Making/ A&P                             Medical Decision Making Amount and/or Complexity of Data Reviewed Labs: ordered. Radiology: ordered.  Risk Prescription drug management.   This patient presents to the ED for concern of laceration head injury and fall, this involves an extensive number of treatment options, and is a complaint that carries with it a high risk of complications and morbidity.  The differential diagnosis includes  mechanical fall as witnessed by staff at facility   Co morbidities that complicate the patient evaluation  Dementia, Parkinson's   Additional history obtained:  Additional history obtained from paramedics and electronic medical record External records from outside source obtained and reviewed including multiple visits to neurology for his parkinsonism, no recent admissions to the hospital, multiple ED visits for falls, head injuries, had been admitted in May 2023 after having a fall and noted to have an acute kidney injury   Lab Tests:  I Ordered, and personally interpreted labs.  The pertinent results include: CBC unremarkable, metabolic panel with chronic renal insufficiency, no changes from baseline, INR of 1.1   Imaging Studies ordered:  I ordered imaging studies including x-rays of hips pelvis humerus shoulder ribs maxillofacial  bones CT scan of the brain and spine I independently visualized and interpreted imaging which showed fracture only of the nasal bones, all other imaging was reassuring, there is arthritis of the hips but no fracture I agree with the radiologist interpretation   Cardiac Monitoring: / EKG:  The patient was maintained on a cardiac monitor.  I personally viewed and interpreted the cardiac monitored which showed an underlying rhythm of: Normal sinus rhythm   Problem List / ED Course / Critical interventions / Medication management  Patient had a fall, lacerations were expertly repaired by physician assistant under my direct observation Can be treated supportive care with Tylenol or ibuprofen as needed for pain I have reviewed the patients home medicines and have made adjustments as needed   Social Determinants of Health:  Nursing home patient   Test / Admission - Considered:  Considered admission but the patient had a benign workup with non-life-threatening injuries however there was significant laceration repair that was required, the patient  tolerated this very well, the family member was at the bedside and agreed with the treatment and disposition back to [facility] facility         Final Clinical Impression(s) / ED Diagnoses Final diagnoses:  Fall, initial encounter  Injury of head, initial encounter  Facial laceration, initial encounter  Complex laceration of nose, initial encounter  Closed fracture of nasal bone, initial encounter    Rx / DC Orders ED Discharge Orders     None         Noemi Chapel, MD 09/09/22 1207

## 2022-09-09 NOTE — ED Triage Notes (Signed)
Pt went to walk this morning without his walker and he fell face down hitting the tile floor. EMS reports pt has deep laceration to forehead. Pt has large amount of dried blood to face coming from the forehead and left nostril. BP 178/80, HR 77, O2 sat 96% RA for EMS. Facility staff reported to EMS that pt did not have LOC upon fall.

## 2022-09-09 NOTE — ED Notes (Signed)
Report given to [facility] at [facility]

## 2022-09-09 NOTE — ED Notes (Signed)
Patient transported to CT 

## 2022-09-09 NOTE — ED Provider Notes (Signed)
Procedures  .Marland KitchenLaceration Repair  Date/Time: 09/09/2022 11:33 AM  Performed by: Wilnette Kales, PA Authorized by: Wilnette Kales, PA   Consent:    Consent obtained:  Verbal   Consent given by:  Patient   Risks, benefits, and alternatives were discussed: yes     Risks discussed:  Infection, need for additional repair, poor wound healing, poor cosmetic result, pain, retained foreign body, tendon damage, nerve damage and vascular damage   Alternatives discussed:  No treatment and delayed treatment Universal protocol:    Procedure explained and questions answered to patient or proxy's satisfaction: yes     Patient identity confirmed:  Verbally with patient Anesthesia:    Anesthesia method:  Local infiltration   Local anesthetic:  Lidocaine 2% WITH epi Laceration details:    Location:  Face   Face location:  Forehead   Length (cm):  3.5 Pre-procedure details:    Preparation:  Patient was prepped and draped in usual sterile fashion and imaging obtained to evaluate for foreign bodies Exploration:    Limited defect created (wound extended): no     Hemostasis achieved with:  Direct pressure   Imaging obtained comment:  CT   Imaging outcome: foreign body not noted     Wound exploration: wound explored through full range of motion and entire depth of wound visualized     Contaminated: no   Treatment:    Area cleansed with:  Saline and povidone-iodine   Amount of cleaning:  Standard   Irrigation solution:  Sterile saline   Irrigation volume:  250cc   Irrigation method:  Syringe   Visualized foreign bodies/material removed: no     Debridement:  None   Undermining:  None   Scar revision: no   Skin repair:    Repair method:  Sutures   Suture size:  6-0   Suture material:  Prolene   Suture technique:  Simple interrupted   Number of sutures:  5 Approximation:    Approximation:  Close Repair type:    Repair type:  Simple Post-procedure details:    Dressing:  Open (no  dressing)   Procedure completion:  Tolerated well, no immediate complications .Marland KitchenLaceration Repair  Date/Time: 09/09/2022 11:34 AM  Performed by: Wilnette Kales, PA Authorized by: Wilnette Kales, PA   Consent:    Consent obtained:  Verbal   Consent given by:  Patient   Risks, benefits, and alternatives were discussed: yes     Risks discussed:  Need for additional repair, nerve damage, poor wound healing, infection, poor cosmetic result, pain, retained foreign body, tendon damage and vascular damage   Alternatives discussed:  No treatment and delayed treatment Universal protocol:    Procedure explained and questions answered to patient or proxy's satisfaction: yes     Patient identity confirmed:  Verbally with patient Anesthesia:    Anesthesia method:  Local infiltration   Local anesthetic:  Lidocaine 2% WITH epi Laceration details:    Location:  Face   Face location:  Nose   Length (cm):  2.6 Pre-procedure details:    Preparation:  Patient was prepped and draped in usual sterile fashion and imaging obtained to evaluate for foreign bodies Exploration:    Limited defect created (wound extended): no     Hemostasis achieved with:  Direct pressure   Imaging obtained comment:  CT   Imaging outcome: foreign body not noted     Wound exploration: wound explored through full range of motion and entire depth  of wound visualized     Contaminated: no   Treatment:    Area cleansed with:  Povidone-iodine and saline   Amount of cleaning:  Standard   Irrigation solution:  Sterile saline   Irrigation volume:  200cc   Irrigation method:  Syringe   Visualized foreign bodies/material removed: no     Debridement:  None   Undermining:  None   Scar revision: no   Skin repair:    Repair method:  Sutures   Suture size:  6-0   Suture material:  Prolene   Suture technique:  Simple interrupted   Number of sutures:  4 Approximation:    Approximation:  Close Repair type:    Repair type:   Simple Post-procedure details:    Dressing:  Open (no dressing)   Procedure completion:  Tolerated well, no immediate complications       Wilnette Kales, PA 09/09/22 1135    Noemi Chapel, MD 09/09/22 [phone]

## 2022-11-01 ENCOUNTER — Emergency Department (HOSPITAL_COMMUNITY): Payer: Medicare Other

## 2022-11-01 ENCOUNTER — Emergency Department (HOSPITAL_COMMUNITY)
Admission: EM | Admit: 2022-11-01 | Discharge: 2022-11-02 | Disposition: A | Discharge status: Home / Self Care | Payer: Medicare Other | Attending: Emergency Medicine | Admitting: Emergency Medicine

## 2022-11-01 ENCOUNTER — Encounter (HOSPITAL_COMMUNITY): Payer: Self-pay | Admitting: *Deleted

## 2022-11-01 ENCOUNTER — Other Ambulatory Visit: Payer: Self-pay

## 2022-11-01 DIAGNOSIS — F0390 Unspecified dementia without behavioral disturbance: Secondary | ICD-10-CM | POA: Diagnosis not present

## 2022-11-01 DIAGNOSIS — R4182 Altered mental status, unspecified: Secondary | ICD-10-CM | POA: Insufficient documentation

## 2022-11-01 DIAGNOSIS — G20A1 Parkinson's disease without dyskinesia, without mention of fluctuations: Secondary | ICD-10-CM | POA: Insufficient documentation

## 2022-11-01 LAB — COMPREHENSIVE METABOLIC PANEL
ALT: 10 U/L (ref 0–44)
AST: 23 U/L (ref 15–41)
Albumin: 4.1 g/dL (ref 3.5–5.0)
Alkaline Phosphatase: 93 U/L (ref 38–126)
Anion gap: 10 (ref 5–15)
BUN: 30 mg/dL — ABNORMAL HIGH (ref 8–23)
CO2: 22 mmol/L (ref 22–32)
Calcium: 8.7 mg/dL — ABNORMAL LOW (ref 8.9–10.3)
Chloride: 105 mmol/L (ref 98–111)
Creatinine, Ser: 1.55 mg/dL — ABNORMAL HIGH (ref 0.61–1.24)
GFR, Estimated: 42 mL/min — ABNORMAL LOW (ref >60)
Glucose, Bld: 105 mg/dL — ABNORMAL HIGH (ref 70–99)
Potassium: 4.1 mmol/L (ref 3.5–5.1)
Sodium: 137 mmol/L (ref 135–145)
Total Bilirubin: 0.6 mg/dL (ref 0.3–1.2)
Total Protein: 7.3 g/dL (ref 6.5–8.1)

## 2022-11-01 LAB — BLOOD GAS, VENOUS
Acid-Base Excess: 4 mmol/L — ABNORMAL HIGH (ref 0.0–2.0)
Bicarbonate: 29.2 mmol/L — ABNORMAL HIGH (ref 20.0–28.0)
Drawn by: 57525
O2 Saturation: 54 %
Patient temperature: 36.6
pCO2, Ven: 44 mmHg (ref 44–60)
pH, Ven: 7.43 (ref 7.25–7.43)
pO2, Ven: 31 mmHg — CL (ref 32–45)

## 2022-11-01 LAB — AMMONIA: Ammonia: 27 umol/L (ref 9–35)

## 2022-11-01 LAB — CBC WITH DIFFERENTIAL/PLATELET
Abs Immature Granulocytes: 0.01 10*3/uL (ref 0.00–0.07)
Basophils Absolute: 0 10*3/uL (ref 0.0–0.1)
Basophils Relative: 1 %
Eosinophils Absolute: 0.3 10*3/uL (ref 0.0–0.5)
Eosinophils Relative: 4 %
HCT: 41.3 % (ref 39.0–52.0)
Hemoglobin: 13.9 g/dL (ref 13.0–17.0)
Immature Granulocytes: 0 %
Lymphocytes Relative: 33 %
Lymphs Abs: 2 10*3/uL (ref 0.7–4.0)
MCH: 30.5 pg (ref 26.0–34.0)
MCHC: 33.7 g/dL (ref 30.0–36.0)
MCV: 90.6 fL (ref 80.0–100.0)
Monocytes Absolute: 1 10*3/uL (ref 0.1–1.0)
Monocytes Relative: 16 %
Neutro Abs: 2.8 10*3/uL (ref 1.7–7.7)
Neutrophils Relative %: 46 %
Platelets: 154 10*3/uL (ref 150–400)
RBC: 4.56 MIL/uL (ref 4.22–5.81)
RDW: 13.3 % (ref 11.5–15.5)
WBC: 6.1 10*3/uL (ref 4.0–10.5)
nRBC: 0 % (ref 0.0–0.2)

## 2022-11-01 LAB — PROTIME-INR
INR: 1.1 (ref 0.8–1.2)
Prothrombin Time: 13.9 seconds (ref 11.4–15.2)

## 2022-11-01 LAB — URINALYSIS, ROUTINE W REFLEX MICROSCOPIC
Bacteria, UA: NONE SEEN
Bilirubin Urine: NEGATIVE
Glucose, UA: NEGATIVE mg/dL
Hgb urine dipstick: NEGATIVE
Ketones, ur: 5 mg/dL — AB
Leukocytes,Ua: NEGATIVE
Nitrite: NEGATIVE
Protein, ur: 30 mg/dL — AB
Specific Gravity, Urine: 1.017 (ref 1.005–1.030)
pH: 6 (ref 5.0–8.0)

## 2022-11-01 MED ORDER — SODIUM CHLORIDE 0.9 % IV SOLN: INTRAVENOUS | Status: DC

## 2022-11-01 NOTE — ED Notes (Signed)
Pt crawled out of bed and ambulated to nursing station. Assisted pt back to bed, brief changed

## 2022-11-01 NOTE — ED Notes (Signed)
Patient transported to CT 

## 2022-11-01 NOTE — Discharge Instructions (Signed)
All of the testing was normal, no signs of stroke, bleeding, pneumonia, infection or dehydration.  Please offer plenty of clear liquids and have the family doctor recheck within 48 hours, ER for worsening symptoms

## 2022-11-01 NOTE — ED Provider Notes (Signed)
[facility] Provider Note   CSN: [id number] Arrival date & time: 11/01/22  1436     History  Chief Complaint  Patient presents with   Altered Mental Status    BOWAN FOWLE is a 89 y.o. male.   Altered Mental Status  This patient is a 89 year old male, history of parkinsonism and dementia, high cholesterol.  Presents to the hospital after having some increasing change in his mental status over the last week.  According to the nurse, [place], at the nursing facility at [facility] where he lives in [place] he has had some decreased participation in activities over the last week, not talking as much, not walking as much and requiring increased amounts of assistance with feeding and daily activities.  He is not able to give me any information and only speaks in 2 word sentences with lots of mumbling.  They deny any other recent falls though he did have a fall 2 months ago when I evaluated him and had a broken nose.  He did not have any signs of brain injury or spinal fractures or other injuries at that time.  Crystal reports that there has been no fevers vomiting diarrhea coughing or shortness of breath    Home Medications Prior to Admission medications   Medication Sig Start Date End Date Taking? Authorizing Provider  acetaminophen (TYLENOL) 325 MG tablet Take 650 mg by mouth every 8 (eight) hours as needed for mild pain.   Yes [provider]  BELSOMRA 5 MG TABS Take 5 mg by mouth at bedtime. 11/25/21  Yes [provider]  carbidopa-levodopa (SINEMET IR) 25-100 MG tablet Take 2 tablets by mouth in the morning. 12/19/21  Yes [provider]  nitroGLYCERIN (NITROSTAT) 0.4 MG SL tablet Place 0.4 mg under the tongue See admin instructions. Every 15 minutes as needed for chest pain 10/16/21  Yes [provider]  polyethylene glycol (MIRALAX / GLYCOLAX) 17 g packet Take 17 g by mouth every 6 (six)  hours as needed for mild constipation.   Yes [provider]  senna-docusate (SENOKOT-S) 8.6-50 MG tablet Take 1 tablet by mouth at bedtime.   Yes [provider]  simvastatin (ZOCOR) 80 MG tablet Take 40 mg by mouth at bedtime. 12/18/21  Yes [provider]  venlafaxine XR (EFFEXOR-XR) 75 MG 24 hr capsule Take 225 mg by mouth at bedtime. 10/23/21  Yes [provider]  Zinc Oxide (BOUDREAUXS BUTT PASTE) 16 % OINT Apply 1 Application topically 3 (three) times daily. Needs to be placed in the buttocks area where there is skin breakdown 3 times a day 01/25/22  Yes Plunkett, Loree Fee, MD      Allergies    Benadryl [diphenhydramine], Imdur [isosorbide nitrate], Iodinated contrast media, Mirtazapine, Penicillins, Shellfish allergy, Tramadol, and Trazodone    Review of Systems   Review of Systems  All other systems reviewed and are negative.   Physical Exam Updated Vital Signs BP (!) 141/73   Pulse 77   Temp 97.8 F (36.6 C) (Axillary)   Resp 16   SpO2 99%  Physical Exam Vitals and nursing note reviewed.  Constitutional:      General: He is not in acute distress.    Appearance: He is well-developed.  HENT:     Head: Normocephalic and atraumatic.     Mouth/Throat:     Pharynx: No oropharyngeal exudate.  Eyes:     General: No scleral icterus.  Right eye: No discharge.        Left eye: No discharge.     Conjunctiva/sclera: Conjunctivae normal.     Pupils: Pupils are equal, round, and reactive to light.  Neck:     Thyroid: No thyromegaly.     Vascular: No JVD.  Cardiovascular:     Rate and Rhythm: Normal rate and regular rhythm.     Heart sounds: Normal heart sounds. No murmur heard.    No friction rub. No gallop.  Pulmonary:     Effort: Pulmonary effort is normal. No respiratory distress.     Breath sounds: Normal breath sounds. No wheezing or rales.  Abdominal:     General: Bowel sounds are normal. There is no distension.     Palpations:  Abdomen is soft. There is no mass.     Tenderness: There is no abdominal tenderness.  Musculoskeletal:        General: No tenderness. Normal range of motion.     Cervical back: Normal range of motion and neck supple.     Right lower leg: No edema.     Left lower leg: No edema.  Lymphadenopathy:     Cervical: No cervical adenopathy.  Skin:    General: Skin is warm and dry.     Findings: No erythema or rash.  Neurological:     Mental Status: He is alert.     Coordination: Coordination normal.     Comments: The patient is somnolent but arousable to loud voice, moves all 4 extremities, does not really answer questions, he grumbles when he is asked questions.  There is no obvious facial droop, no drooling, pupils are equal round and reactive and his extraocular movements appear intact  Psychiatric:        Behavior: Behavior normal.     ED Results / Procedures / Treatments   Labs (all labs ordered are listed, but only abnormal results are displayed) Labs Reviewed  COMPREHENSIVE METABOLIC PANEL - Abnormal; Notable for the following components:      Result Value   Glucose, Bld 105 (*)    BUN 30 (*)    Creatinine, Ser 1.55 (*)    Calcium 8.7 (*)    GFR, Estimated 42 (*)    All other components within normal limits  URINALYSIS, ROUTINE W REFLEX MICROSCOPIC - Abnormal; Notable for the following components:   Ketones, ur 5 (*)    Protein, ur 30 (*)    All other components within normal limits  BLOOD GAS, VENOUS - Abnormal; Notable for the following components:   pO2, Ven 31 (*)    Bicarbonate 29.2 (*)    Acid-Base Excess 4.0 (*)    All other components within normal limits  CBC WITH DIFFERENTIAL/PLATELET  AMMONIA  PROTIME-INR  CBG MONITORING, ED    EKG EKG Interpretation  Date/Time:  Monday November 01 2022 14:49:31 EDT Ventricular Rate:  74 PR Interval:  242 QRS Duration: 100 QT Interval:  419 QTC Calculation: 465 R Axis:   -51 Text Interpretation: Sinus rhythm Prolonged  PR interval LAD, consider left anterior fascicular block Consider right ventricular hypertrophy Confirmed by Noemi Chapel [phone]) on 11/01/2022 3:49:30 PM  Radiology CT HEAD WO CONTRAST  Result Date: 11/01/2022 CLINICAL DATA:  Altered mental status. EXAM: CT HEAD WITHOUT CONTRAST TECHNIQUE: Contiguous axial images were obtained from the base of the skull through the vertex without intravenous contrast. RADIATION DOSE REDUCTION: This exam was performed according to the departmental dose-optimization program which includes automated exposure  control, adjustment of the mA and/or kV according to patient size and/or use of iterative reconstruction technique. COMPARISON:  Head CT 09/09/2022. FINDINGS: Brain: No acute hemorrhage. Unchanged chronic small-vessel disease. Cortical gray-white differentiation is otherwise preserved. Prominence of the ventricles and sulci within normal limits for age. No extra-axial collection. Basilar cisterns are patent. Vascular: No hyperdense vessel or unexpected calcification. Skull: No calvarial fracture or suspicious bone lesion. Skull base is unremarkable. Sinuses/Orbits: Unremarkable. Other: None. IMPRESSION: 1. No acute intracranial abnormality. 2. Unchanged chronic small-vessel disease. Electronically Signed   By: Emmit Alexanders M.D.   On: 11/01/2022 15:59   DG Chest Port 1 View  Result Date: 11/01/2022 CLINICAL DATA:  Altered mental status. EXAM: PORTABLE CHEST 1 VIEW COMPARISON:  Chest x-ray dated September 09, 2022. FINDINGS: The heart size and mediastinal contours are within normal limits. Normal pulmonary vascularity. Persistent low lung volumes. No focal consolidation, pleural effusion, or pneumothorax. No acute osseous abnormality. Colonic gas underneath the right hemidiaphragm. IMPRESSION: 1. No active disease. Electronically Signed   By: Titus Dubin M.D.   On: 11/01/2022 15:48    Procedures Procedures    Medications Ordered in ED Medications  0.9 %  sodium  chloride infusion ( Intravenous New Bag/Given 11/01/22 1615)    ED Course/ Medical Decision Making/ A&P                             Medical Decision Making Amount and/or Complexity of Data Reviewed Labs: ordered. Radiology: ordered.  Risk Prescription drug management.   This patient presents to the ED for concern of altered mental status, this involves an extensive number of treatment options, and is a complaint that carries with it a high risk of complications and morbidity.  The differential diagnosis includes delirium, progressive dementia, underlying infection or toxic metabolic abnormalities, consider UTI, hypokalemia, renal failure, liver failure, stroke   Co morbidities that complicate the patient evaluation  Dementia, Parkinson's   Additional history obtained:  Additional history obtained from electronic medical record External records from outside source obtained and reviewed including prior imaging including CT scan of the brain performed 2 months ago which showed nasal bone fracture but no brain injury   Lab Tests:  I Ordered, and personally interpreted labs.  The pertinent results include: Renal insufficiency but at baseline, no leukocytosis no urinary infection   Imaging Studies ordered:  I ordered imaging studies including CT scan of the brain and chest x-ray I independently visualized and interpreted imaging which showed no acute findings I agree with the radiologist interpretation   Cardiac Monitoring: / EKG:  The patient was maintained on a cardiac monitor.  I personally viewed and interpreted the cardiac monitored which showed an underlying rhythm of: Normal sinus rhythm    Problem List / ED Course / Critical interventions / Medication management  Nothing was found to be a cause of altered mental status, the patient is able to wake up and has normal vital signs, he is not hypercapnic or hypoxic I have reviewed the patients home medicines and have  made adjustments as needed   Social Determinants of Health:  Dementia, nursing home   Test / Admission - Considered:  I spoke with both the patient's nurse at the nursing home initially as well as trying to leave a message with a family member, left a message on the machine as nobody answered the phone.  Nursing facility aware the patient will be coming back when  nothing was found.  Labs and testing reassuring, patient stable hemodynamically         Final Clinical Impression(s) / ED Diagnoses Final diagnoses:  Altered mental status, unspecified altered mental status type    Rx / DC Orders ED Discharge Orders     None         Noemi Chapel, MD 11/01/22 1753

## 2022-11-01 NOTE — ED Triage Notes (Signed)
Pt brought in by [facility] from [facility] for increased in his AMS; staff reported that pt is usually more alert and gets up and moves around but the last few days pt has been more lethargic  Pt during triage is having snoring respirations and is only alert to voice;  Pt denies any pain

## 2022-12-19 ENCOUNTER — Emergency Department (HOSPITAL_COMMUNITY): Payer: No Typology Code available for payment source

## 2022-12-19 ENCOUNTER — Emergency Department (HOSPITAL_COMMUNITY)
Admission: EM | Admit: 2022-12-19 | Discharge: 2022-12-20 | Disposition: A | Discharge status: Skilled Nursing Facility | Payer: No Typology Code available for payment source | Attending: Emergency Medicine | Admitting: Emergency Medicine

## 2022-12-19 DIAGNOSIS — S12001A Unspecified nondisplaced fracture of first cervical vertebra, initial encounter for closed fracture: Secondary | ICD-10-CM

## 2022-12-19 DIAGNOSIS — S12111A Posterior displaced Type II dens fracture, initial encounter for closed fracture: Secondary | ICD-10-CM | POA: Diagnosis not present

## 2022-12-19 DIAGNOSIS — I2510 Atherosclerotic heart disease of native coronary artery without angina pectoris: Secondary | ICD-10-CM | POA: Diagnosis not present

## 2022-12-19 DIAGNOSIS — Z23 Encounter for immunization: Secondary | ICD-10-CM | POA: Diagnosis not present

## 2022-12-19 DIAGNOSIS — S299XXA Unspecified injury of thorax, initial encounter: Secondary | ICD-10-CM | POA: Diagnosis not present

## 2022-12-19 DIAGNOSIS — Z955 Presence of coronary angioplasty implant and graft: Secondary | ICD-10-CM | POA: Insufficient documentation

## 2022-12-19 DIAGNOSIS — H9190 Unspecified hearing loss, unspecified ear: Secondary | ICD-10-CM | POA: Insufficient documentation

## 2022-12-19 DIAGNOSIS — Y92129 Unspecified place in nursing home as the place of occurrence of the external cause: Secondary | ICD-10-CM | POA: Insufficient documentation

## 2022-12-19 DIAGNOSIS — N183 Chronic kidney disease, stage 3 unspecified: Secondary | ICD-10-CM | POA: Insufficient documentation

## 2022-12-19 DIAGNOSIS — S0101XA Laceration without foreign body of scalp, initial encounter: Secondary | ICD-10-CM | POA: Insufficient documentation

## 2022-12-19 DIAGNOSIS — W19XXXA Unspecified fall, initial encounter: Secondary | ICD-10-CM | POA: Diagnosis not present

## 2022-12-19 DIAGNOSIS — S3991XA Unspecified injury of abdomen, initial encounter: Secondary | ICD-10-CM | POA: Diagnosis not present

## 2022-12-19 DIAGNOSIS — G20A1 Parkinson's disease without dyskinesia, without mention of fluctuations: Secondary | ICD-10-CM | POA: Insufficient documentation

## 2022-12-19 DIAGNOSIS — I129 Hypertensive chronic kidney disease with stage 1 through stage 4 chronic kidney disease, or unspecified chronic kidney disease: Secondary | ICD-10-CM | POA: Insufficient documentation

## 2022-12-19 DIAGNOSIS — F0390 Unspecified dementia without behavioral disturbance: Secondary | ICD-10-CM | POA: Diagnosis not present

## 2022-12-19 DIAGNOSIS — E785 Hyperlipidemia, unspecified: Secondary | ICD-10-CM | POA: Insufficient documentation

## 2022-12-19 DIAGNOSIS — S199XXA Unspecified injury of neck, initial encounter: Secondary | ICD-10-CM | POA: Diagnosis present

## 2022-12-19 LAB — CBC WITH DIFFERENTIAL/PLATELET
Abs Immature Granulocytes: 0.02 10*3/uL (ref 0.00–0.07)
Basophils Absolute: 0 10*3/uL (ref 0.0–0.1)
Basophils Relative: 1 %
Eosinophils Absolute: 0.2 10*3/uL (ref 0.0–0.5)
Eosinophils Relative: 5 %
HCT: 34 % — ABNORMAL LOW (ref 39.0–52.0)
Hemoglobin: 11.2 g/dL — ABNORMAL LOW (ref 13.0–17.0)
Immature Granulocytes: 0 %
Lymphocytes Relative: 32 %
Lymphs Abs: 1.5 10*3/uL (ref 0.7–4.0)
MCH: 30.6 pg (ref 26.0–34.0)
MCHC: 32.9 g/dL (ref 30.0–36.0)
MCV: 92.9 fL (ref 80.0–100.0)
Monocytes Absolute: 0.7 10*3/uL (ref 0.1–1.0)
Monocytes Relative: 14 %
Neutro Abs: 2.3 10*3/uL (ref 1.7–7.7)
Neutrophils Relative %: 48 %
Platelets: 119 10*3/uL — ABNORMAL LOW (ref 150–400)
RBC: 3.66 MIL/uL — ABNORMAL LOW (ref 4.22–5.81)
RDW: 13.3 % (ref 11.5–15.5)
WBC: 4.7 10*3/uL (ref 4.0–10.5)
nRBC: 0 % (ref 0.0–0.2)

## 2022-12-19 LAB — COMPREHENSIVE METABOLIC PANEL
ALT: 14 U/L (ref 0–44)
AST: 30 U/L (ref 15–41)
Albumin: 3.5 g/dL (ref 3.5–5.0)
Alkaline Phosphatase: 73 U/L (ref 38–126)
Anion gap: 7 (ref 5–15)
BUN: 29 mg/dL — ABNORMAL HIGH (ref 8–23)
CO2: 25 mmol/L (ref 22–32)
Calcium: 8.3 mg/dL — ABNORMAL LOW (ref 8.9–10.3)
Chloride: 104 mmol/L (ref 98–111)
Creatinine, Ser: 1.38 mg/dL — ABNORMAL HIGH (ref 0.61–1.24)
GFR, Estimated: 48 mL/min — ABNORMAL LOW (ref >60)
Glucose, Bld: 166 mg/dL — ABNORMAL HIGH (ref 70–99)
Potassium: 3.9 mmol/L (ref 3.5–5.1)
Sodium: 136 mmol/L (ref 135–145)
Total Bilirubin: 0.6 mg/dL (ref 0.3–1.2)
Total Protein: 6.4 g/dL — ABNORMAL LOW (ref 6.5–8.1)

## 2022-12-19 LAB — URINALYSIS, W/ REFLEX TO CULTURE (INFECTION SUSPECTED)
Bacteria, UA: NONE SEEN
Bilirubin Urine: NEGATIVE
Glucose, UA: NEGATIVE mg/dL
Hgb urine dipstick: NEGATIVE
Ketones, ur: NEGATIVE mg/dL
Nitrite: NEGATIVE
Protein, ur: NEGATIVE mg/dL
Specific Gravity, Urine: 1.014 (ref 1.005–1.030)
pH: 7 (ref 5.0–8.0)

## 2022-12-19 LAB — LACTIC ACID, PLASMA: Lactic Acid, Venous: 1.3 mmol/L (ref 0.5–1.9)

## 2022-12-19 LAB — CBG MONITORING, ED: Glucose-Capillary: 121 mg/dL — ABNORMAL HIGH (ref 70–99)

## 2022-12-19 MED ORDER — LORAZEPAM 2 MG/ML IJ SOLN
0.5000 mg | Freq: Once | INTRAMUSCULAR | Status: AC
  Administered 2022-12-20: 0.5 mg via INTRAVENOUS
  Filled 2022-12-19: qty 1

## 2022-12-19 MED ORDER — FENTANYL CITRATE PF 50 MCG/ML IJ SOSY
25.0000 ug | PREFILLED_SYRINGE | Freq: Once | INTRAMUSCULAR | Status: AC
  Administered 2022-12-19: 25 ug via INTRAVENOUS
  Filled 2022-12-19: qty 1

## 2022-12-19 MED ORDER — TETANUS-DIPHTH-ACELL PERTUSSIS 5-2.5-18.5 LF-MCG/0.5 IM SUSY
0.5000 mL | PREFILLED_SYRINGE | Freq: Once | INTRAMUSCULAR | Status: AC
  Administered 2022-12-19: 0.5 mL via INTRAMUSCULAR
  Filled 2022-12-19: qty 0.5

## 2022-12-19 MED ORDER — ONDANSETRON HCL 4 MG/2ML IJ SOLN
4.0000 mg | Freq: Once | INTRAMUSCULAR | Status: AC
  Administered 2022-12-19: 4 mg via INTRAVENOUS
  Filled 2022-12-19: qty 2

## 2022-12-19 NOTE — ED Triage Notes (Signed)
Patient BIB by EMS for unwitnessed fall, lac to the top of head, pt has hx of falls, typically wears a helmet on the dementia unit, pt was not wearing at time of fall, unsure if LOC. No thinners per EMS. C/o neck pain, EMS unable to put on c-collar on d/t pt hollering in pain.

## 2022-12-19 NOTE — ED Provider Notes (Signed)
[name] Provider Note   CSN: [id number] Arrival date & time: 12/19/22  2021     History  Chief Complaint  Patient presents with   Jose Gilbert is a 89 y.o. male with parkinson's disease w/ dementia, CKD stage 3, HTN, CAD s/p CABG, MDD who presents with fall, head trauma.   Patient BIB by EMS for unwitnessed fall, lac to the top of head, pt has hx of falls, typically wears a helmet on the dementia unit, pt was not wearing at time of fall, unsure if LOC. No thinners per EMS. C/o neck pain, EMS unable to put on c-collar on d/t pt hollering in pain. Patient is unable to respond to questions, per chart review and EMS this is baseline for his severe dementia. He is uncooperative with physical exam and appears to be in extreme pain in his neck/head. He continues to rip off C-collar after it is placed. Bleeding from large lac on top of head is controlled.   Fall       Home Medications Prior to Admission medications   Medication Sig Start Date End Date Taking? Authorizing Provider  acetaminophen (TYLENOL) 325 MG tablet Take 650 mg by mouth every 8 (eight) hours as needed for mild pain.    [provider]  carbidopa-levodopa (SINEMET IR) 25-100 MG tablet Take 2 tablets by mouth in the morning. 12/19/21   [provider]  feeding supplement, ENSURE COMPLETE, (ENSURE COMPLETE) LIQD Take 237 mLs by mouth 2 (two) times daily between meals. 12/27/22   Vassie Loll, MD  nitroGLYCERIN (NITROSTAT) 0.4 MG SL tablet Place 0.4 mg under the tongue See admin instructions. 10/16/21   [provider]  polyethylene glycol (MIRALAX / GLYCOLAX) 17 g packet Take 17 g by mouth every 6 (six) hours as needed for mild constipation.    [provider]  senna-docusate (SENOKOT-S) 8.6-50 MG tablet Take 1 tablet by mouth at bedtime.    [provider]  simvastatin (ZOCOR) 40 MG tablet Take 1 tablet (40 mg total) by  mouth at bedtime. 12/27/22   Vassie Loll, MD  tamsulosin (FLOMAX) 0.4 MG CAPS capsule Take 1 capsule (0.4 mg total) by mouth daily after supper. 12/27/22   Vassie Loll, MD  venlafaxine XR (EFFEXOR-XR) 75 MG 24 hr capsule Take 225 mg by mouth at bedtime. 10/23/21   [provider]      Allergies    Benadryl [diphenhydramine], Imdur [isosorbide nitrate], Iodinated contrast media, Mirtazapine, Penicillins, Shellfish allergy, Tramadol, and Trazodone    Review of Systems   Review of Systems  Unable to perform ROS: Dementia    Physical Exam Updated Vital Signs BP (!) 157/69   Pulse (!) 107   Temp 97.7 F (36.5 C) (Oral)   Resp (!) 21   SpO2 95%  Physical Exam General: Moderate distress elderly male, lying in bed.  HEENT: PERRLA, EOMI, sclera anicteric, MMM, trachea midline.  Stable forehead, midface, nasal bridge.  No nasal septal hematoma.  No deformities to the mandible.  Large L-shaped laceration to the scalp on the crown of patient's head in the midline, slow venous oozing of blood with 1 very tiny apparently pulsating arterial bleed noted when wound is uncovered.  [place] undisturbed.  Not grossly contaminated.  Severe midline C-spine tenderness palpation without any step-offs or deformities.  C-collar in place. Cardiology: RRR, no murmurs/rubs/gallops. BL radial and DP pulses equal bilaterally.  Possible chest wall tenderness palpation  anteriorly bilaterally without any crepitus or deformities noted Resp: Normal respiratory rate and effort. CTAB, no wheezes, rhonchi, crackles.  Abd: Possible abdominal tenderness palpation, not rigid, no guarding  Pelvis: Pelvis stable and nontender. MSK: No peripheral edema or signs of trauma. Extremities without deformity or TTP. No cyanosis or clubbing. Skin: warm, dry. No rashes or lesions. Back: No T or L spine TTP  Psych: Moderate emotional distress  ED Results / Procedures / Treatments   Labs (all labs ordered are listed, but only  abnormal results are displayed) Labs Reviewed  CBC WITH DIFFERENTIAL/PLATELET - Abnormal; Notable for the following components:      Result Value   RBC 3.66 (*)    Hemoglobin 11.2 (*)    HCT 34.0 (*)    Platelets 119 (*)    All other components within normal limits  COMPREHENSIVE METABOLIC PANEL - Abnormal; Notable for the following components:   Glucose, Bld 166 (*)    BUN 29 (*)    Creatinine, Ser 1.38 (*)    Calcium 8.3 (*)    Total Protein 6.4 (*)    GFR, Estimated 48 (*)    All other components within normal limits  URINALYSIS, W/ REFLEX TO CULTURE (INFECTION SUSPECTED) - Abnormal; Notable for the following components:   Leukocytes,Ua TRACE (*)    All other components within normal limits  CBG MONITORING, ED - Abnormal; Notable for the following components:   Glucose-Capillary 121 (*)    All other components within normal limits  LACTIC ACID, PLASMA    EKG EKG Interpretation  Date/Time:  Sunday Dec 19 2022 21:03:56 EDT Ventricular Rate:  74 PR Interval:  234 QRS Duration: 116 QT Interval:  408 QTC Calculation: 453 R Axis:   -5 Text Interpretation: Sinus rhythm Prolonged PR interval Incomplete right bundle branch block Confirmed by Vivi Barrack [phone]) on 12/19/2022 10:15:49 PM  Radiology No results found.  Procedures .Marland KitchenLaceration Repair  Date/Time: 01/06/2023 9:21 PM  Performed by: Loetta Rough, MD Authorized by: Loetta Rough, MD   Consent:    Consent obtained:  Emergent situation Universal protocol:    Patient identity confirmed:  Arm band Anesthesia:    Anesthesia method:  None Laceration details:    Length (cm):  10   Depth (mm):  5 Pre-procedure details:    Preparation:  Imaging obtained to evaluate for foreign bodies Exploration:    Hemostasis achieved with:  Cautery (and staples)   Imaging obtained comment:  CT   Imaging outcome: foreign body noted     Wound exploration: wound explored through full range of motion and entire depth of wound  visualized     Wound extent: vascular damage     Wound extent: areolar tissue not violated, fascia not violated, no foreign body, no signs of injury, no nerve damage, no tendon damage and no underlying fracture     Contaminated: no   Treatment:    Area cleansed with:  Saline   Amount of cleaning:  Extensive   Irrigation volume:  1000   Visualized foreign bodies/material removed: no   Skin repair:    Repair method:  Staples   Number of staples:  9 Approximation:    Approximation:  Close Repair type:    Repair type:  Intermediate Post-procedure details:    Dressing:  Sterile dressing   Procedure completion:  Tolerated with difficulty     Medications Ordered in ED Medications  fentaNYL (SUBLIMAZE) injection 25 mcg (25 mcg Intravenous Given 12/19/22 2158)  Tdap (BOOSTRIX) injection 0.5 mL (0.5 mLs Intramuscular Given 12/19/22 2203)  ondansetron (ZOFRAN) injection 4 mg (4 mg Intravenous Given 12/19/22 2202)  fentaNYL (SUBLIMAZE) injection 25 mcg (25 mcg Intravenous Given 12/19/22 2256)  LORazepam (ATIVAN) injection 0.5 mg (0.5 mg Intravenous Given 12/20/22 0001)    ED Course/ Medical Decision Making/ A&P                          Medical Decision Making Amount and/or Complexity of Data Reviewed Labs: ordered. Decision-making details documented in ED Course. Radiology: ordered. Decision-making details documented in ED Course.  Risk Prescription drug management.    This patient presents to the ED for concern of fall, head lac, neck pain, this involves an extensive number of treatment options, and is a complaint that carries with it a high risk of complications and morbidity.  I considered the following differential and admission for this acute, potentially life threatening condition. He is overall normotensive and mildly tachycardic, likely d/t emotional distress/pain.   MDM:    DDX for trauma includes but is not limited to:  -Head Injury such as skull fx or ICH - patient is  reportedly at his baseline mental status but there is no staff or family here to confirm, will obtain CTH to r/o ICH -Chest Injury and Abdominal Injury - no obvious injuries to chest/abdomen, but possible TTP with chest wall/abdominal wall, unclear, will obtain CT C/A/P -Spinal Cord or Vertebral injury - pt moving all extremities but c/o severe pain to c-spine. Attempting to keep C-collar on patient though difficult with his dementia and inability to understand -Hemorrhage  - consider blood loss anemia from large laceration to scalp. He is normotensive and when calm he is not tachycardic, low c/f hemorrhagic shock. However, patient is noted to have a small arterial bleed coming from his scalp wound that needed immediate repair. Was able to stop the bleed by repairing with 9 staples in the head after cleaning with copious normal saline. -Fractures - no deformity of extremities, no TTP to chest/pelvis   Clinical Course as of 01/06/23 2046  Sun Dec 19, 2022  2127 CT Head Wo Contrast Atrophy, chronic microvascular disease.  No acute intracranial abnormality.   [HN]  2127 CT Cervical Spine Wo Contrast C1 and C2 fractures [HN]  2214 Glucose-Capillary(!): 121 [HN]  2328 Creatinine(!): 1.38 C/w priors [HN]  2328 WBC: 4.7 No leukocytosis [HN]  2328 Hemoglobin(!): 11.2 Down from 13.9 one month ago [HN]  2328 Urinalysis, w/ Reflex to Culture (Infection Suspected) -Urine, Clean Catch(!) Neg for UTI or RBCs [HN]  2328 Platelets(!): 119 Mild thrombocytopenia [HN]  2329 Lactic Acid, Venous: 1.3 [HN]  2329 DG Chest Portable 1 View No active cardiopulmonary disease. [HN]  2329 DG Pelvis Portable 1. No acute fracture or dislocation. 2. Mild bilateral hip arthritic changes.   [HN]  2329 CT Cervical Spine Wo Contrast Type 2 odontoid fracture with mild displacement/distraction.  Fractures through the lateral aspects of the C1 ring bilaterally.   [HN]  2329 Patient picked one of his staples out  of his head lac. Also having difficulty getting patient to maintain his C_collar d/t his dementia. Now with dressing on scalp and mittens. [HN]  2330 Consulted to neurosurgery. [HN]  2351 CT T-SPINE NO CHARGE 1. No acute/traumatic thoracic or lumbar spine pathology. 2. Osteopenia with multilevel degenerative changes.   [HN]  2354 CT CHEST ABDOMEN PELVIS WO CONTRAST 1. No acute/traumatic intrathoracic, abdominal, or pelvic pathology.  2. Sigmoid diverticulosis. No bowel obstruction. 3.  Aortic Atherosclerosis (ICD10-I70.0).   [HN]  2358 D/w Patrici Ranks w/ NSGY. Would recommend maintaining C-collar and f/u in clinic 2-4 weeks.   Patient was becoming mildly agitated, pulling on C-collar and  his wound, was given 0.5 mg IV lorazepam which calmed him significantly.  [HN]  Mon Dec 20, 2022  0003 Called Patient's daughter Rickey Barbara, who states that he was in his room at his facility, he isn't steady without his walker and sometimes forgets to use it. He fell and hit his head. Patient is in the memory care unit at [facility]. Discussed with her his findings and recommendations from neurosurgery.   Discussed with her the importance of patient keeping collar in place. Will relay that to the facility as well. Recommended tyelnol/ibuprofen for pain control. Patient Will be DC'd back to facility with instructions to remove 9 staples in 7 days and to maintain C-collar, f/u with NSGY. DC w/ discharge instructions/return precautions.  [HN]    Clinical Course User Index [HN] Loetta Rough, MD    Labs: I Ordered, and personally interpreted labs.  The pertinent results include:  those listed above  Imaging Studies ordered: I ordered imaging studies including CTH, CT C-spine, CT C/A/P I independently visualized and interpreted imaging. I agree with the radiologist interpretation  Additional history obtained from chart review, EMS.    Cardiac Monitoring: The  patient was maintained on a cardiac monitor.  I personally viewed and interpreted the cardiac monitored which showed an underlying rhythm of: NSR, sinus tachycardia  Reevaluation: After the interventions noted above, I reevaluated the patient and found that they have :improved  Social Determinants of Health: Lives in memory care unit  Disposition:  DC w/ sterile dressing, staples in head lac, collar in place, w/ discharge instructions/return precautions given verbally to daughter and facility as well as in writing.    Co morbidities that complicate the patient evaluation  Past Medical History:  Diagnosis Date   Atherosclerotic heart disease of native coronary artery without angina pectoris    CAD (coronary artery disease) 12/31/2021   CKD (chronic kidney disease)    Constipation    Dementia (HCC)    GERD (gastroesophageal reflux disease)    History of falling    Hyperlipemia    Hypertension    Insomnia    MDD (major depressive disorder)    Parkinson's disease    PVD (peripheral vascular disease) (HCC)      Medicines Meds ordered this encounter  Medications   fentaNYL (SUBLIMAZE) injection 25 mcg   Tdap (BOOSTRIX) injection 0.5 mL   ondansetron (ZOFRAN) injection 4 mg   fentaNYL (SUBLIMAZE) injection 25 mcg   LORazepam (ATIVAN) injection 0.5 mg   DISCONTD: acetaminophen (TYLENOL) tablet 1,000 mg    I have reviewed the patients home medicines and have made adjustments as needed  Problem List / ED Course: Problem List Items Addressed This Visit   None Visit Diagnoses     Closed odontoid fracture with type II morphology and posterior displacement, initial encounter (HCC)    -  Primary   Fall, initial encounter       Laceration of scalp, initial encounter       Closed nondisplaced fracture of first cervical vertebra, unspecified fracture morphology, initial encounter [facility])                       This note was created using dictation software, which  may  contain spelling or grammatical errors.    Loetta Rough, MD 01/07/23 [phone]

## 2022-12-20 ENCOUNTER — Other Ambulatory Visit: Payer: Self-pay

## 2022-12-20 ENCOUNTER — Encounter (HOSPITAL_COMMUNITY): Payer: Self-pay | Admitting: Emergency Medicine

## 2022-12-20 MED ORDER — ACETAMINOPHEN 500 MG PO TABS: 1000.0000 mg | ORAL_TABLET | Freq: Once | ORAL | Status: DC

## 2022-12-20 NOTE — Discharge Instructions (Addendum)
Thank you for coming to [facility] Emergency Department. You were seen for fall. We did an exam, labs, and imaging, and these showed fractures of C1 and C2 vertebrae. Please maintain the C collar at all times. You will need to follow up with Dr. Maisie Fus in neurosurgery clinic in 2-4 weeks. You can alternate taking Tylenol and ibuprofen as needed for pain. You can take 650mg  tylenol (acetaminophen) every 4-6 hours, and 600 mg ibuprofen 3 times a day.  The laceration of your head was repaired with 9 staples, which will need to be removed in 7 days. Please keep the wound clean and dry. Please watch for signs of infection including increased redness, swelling, pus drainage, or fevers/chills.  Please follow up with your primary care provider at the facility within 3 days for follow-up.   Do not hesitate to return to the ED or call 911 if you experience: -Worsening symptoms -Signs of infection of wound -Numbness/tingling -Weakness of limbs -Pain unable to be controlled at home -Lightheadedness, passing out -Fevers/chills -Anything else that concerns you

## 2022-12-22 ENCOUNTER — Inpatient Hospital Stay (HOSPITAL_COMMUNITY)
Admission: EM | Admit: 2022-12-22 | Discharge: 2022-12-27 | DRG: 056 | Disposition: A | Discharge status: Skilled Nursing Facility | Payer: Medicare Other | Source: Skilled Nursing Facility | Attending: Internal Medicine | Admitting: Internal Medicine

## 2022-12-22 ENCOUNTER — Emergency Department (HOSPITAL_COMMUNITY): Payer: Medicare Other

## 2022-12-22 ENCOUNTER — Encounter (HOSPITAL_COMMUNITY): Payer: Self-pay | Admitting: Emergency Medicine

## 2022-12-22 ENCOUNTER — Other Ambulatory Visit: Payer: Self-pay

## 2022-12-22 DIAGNOSIS — F0280 Dementia in other diseases classified elsewhere without behavioral disturbance: Secondary | ICD-10-CM | POA: Diagnosis present

## 2022-12-22 DIAGNOSIS — S12001A Unspecified nondisplaced fracture of first cervical vertebra, initial encounter for closed fracture: Secondary | ICD-10-CM

## 2022-12-22 DIAGNOSIS — E876 Hypokalemia: Secondary | ICD-10-CM

## 2022-12-22 DIAGNOSIS — K219 Gastro-esophageal reflux disease without esophagitis: Secondary | ICD-10-CM | POA: Diagnosis present

## 2022-12-22 DIAGNOSIS — G939 Disorder of brain, unspecified: Principal | ICD-10-CM

## 2022-12-22 DIAGNOSIS — R339 Retention of urine, unspecified: Secondary | ICD-10-CM | POA: Diagnosis present

## 2022-12-22 DIAGNOSIS — Z79899 Other long term (current) drug therapy: Secondary | ICD-10-CM

## 2022-12-22 DIAGNOSIS — I2510 Atherosclerotic heart disease of native coronary artery without angina pectoris: Secondary | ICD-10-CM | POA: Diagnosis present

## 2022-12-22 DIAGNOSIS — S12110D Anterior displaced Type II dens fracture, subsequent encounter for fracture with routine healing: Secondary | ICD-10-CM

## 2022-12-22 DIAGNOSIS — Z9181 History of falling: Secondary | ICD-10-CM

## 2022-12-22 DIAGNOSIS — F02818 Dementia in other diseases classified elsewhere, unspecified severity, with other behavioral disturbance: Secondary | ICD-10-CM | POA: Diagnosis present

## 2022-12-22 DIAGNOSIS — G20A1 Parkinson's disease without dyskinesia, without mention of fluctuations: Principal | ICD-10-CM | POA: Diagnosis present

## 2022-12-22 DIAGNOSIS — S12100A Unspecified displaced fracture of second cervical vertebra, initial encounter for closed fracture: Secondary | ICD-10-CM

## 2022-12-22 DIAGNOSIS — E860 Dehydration: Secondary | ICD-10-CM

## 2022-12-22 DIAGNOSIS — I130 Hypertensive heart and chronic kidney disease with heart failure and stage 1 through stage 4 chronic kidney disease, or unspecified chronic kidney disease: Secondary | ICD-10-CM | POA: Diagnosis present

## 2022-12-22 DIAGNOSIS — R338 Other retention of urine: Secondary | ICD-10-CM

## 2022-12-22 DIAGNOSIS — R1310 Dysphagia, unspecified: Secondary | ICD-10-CM | POA: Diagnosis present

## 2022-12-22 DIAGNOSIS — I739 Peripheral vascular disease, unspecified: Secondary | ICD-10-CM | POA: Diagnosis present

## 2022-12-22 DIAGNOSIS — I10 Essential (primary) hypertension: Secondary | ICD-10-CM | POA: Diagnosis present

## 2022-12-22 DIAGNOSIS — N1831 Chronic kidney disease, stage 3a: Secondary | ICD-10-CM | POA: Diagnosis present

## 2022-12-22 DIAGNOSIS — Z888 Allergy status to other drugs, medicaments and biological substances status: Secondary | ICD-10-CM

## 2022-12-22 DIAGNOSIS — W19XXXD Unspecified fall, subsequent encounter: Secondary | ICD-10-CM | POA: Diagnosis present

## 2022-12-22 DIAGNOSIS — Z91013 Allergy to seafood: Secondary | ICD-10-CM

## 2022-12-22 DIAGNOSIS — E785 Hyperlipidemia, unspecified: Secondary | ICD-10-CM | POA: Diagnosis present

## 2022-12-22 DIAGNOSIS — Z880 Allergy status to penicillin: Secondary | ICD-10-CM

## 2022-12-22 DIAGNOSIS — R4182 Altered mental status, unspecified: Secondary | ICD-10-CM | POA: Diagnosis not present

## 2022-12-22 DIAGNOSIS — F05 Delirium due to known physiological condition: Secondary | ICD-10-CM | POA: Diagnosis present

## 2022-12-22 DIAGNOSIS — Z91041 Radiographic dye allergy status: Secondary | ICD-10-CM

## 2022-12-22 DIAGNOSIS — F329 Major depressive disorder, single episode, unspecified: Secondary | ICD-10-CM | POA: Diagnosis present

## 2022-12-22 DIAGNOSIS — S12001D Unspecified nondisplaced fracture of first cervical vertebra, subsequent encounter for fracture with routine healing: Secondary | ICD-10-CM

## 2022-12-22 DIAGNOSIS — K5900 Constipation, unspecified: Secondary | ICD-10-CM | POA: Diagnosis present

## 2022-12-22 DIAGNOSIS — Z681 Body mass index (BMI) 19 or less, adult: Secondary | ICD-10-CM

## 2022-12-22 DIAGNOSIS — T428X6A Underdosing of antiparkinsonism drugs and other central muscle-tone depressants, initial encounter: Secondary | ICD-10-CM | POA: Diagnosis present

## 2022-12-22 DIAGNOSIS — E43 Unspecified severe protein-calorie malnutrition: Secondary | ICD-10-CM | POA: Diagnosis present

## 2022-12-22 DIAGNOSIS — R54 Age-related physical debility: Secondary | ICD-10-CM | POA: Diagnosis present

## 2022-12-22 DIAGNOSIS — Y636 Underdosing and nonadministration of necessary drug, medicament or biological substance: Secondary | ICD-10-CM | POA: Diagnosis present

## 2022-12-22 DIAGNOSIS — G9340 Encephalopathy, unspecified: Secondary | ICD-10-CM | POA: Diagnosis not present

## 2022-12-22 DIAGNOSIS — Z1152 Encounter for screening for COVID-19: Secondary | ICD-10-CM

## 2022-12-22 DIAGNOSIS — G9341 Metabolic encephalopathy: Secondary | ICD-10-CM | POA: Diagnosis present

## 2022-12-22 DIAGNOSIS — Z66 Do not resuscitate: Secondary | ICD-10-CM | POA: Diagnosis present

## 2022-12-22 DIAGNOSIS — Z885 Allergy status to narcotic agent status: Secondary | ICD-10-CM

## 2022-12-22 DIAGNOSIS — Z634 Disappearance and death of family member: Secondary | ICD-10-CM

## 2022-12-22 DIAGNOSIS — I5031 Acute diastolic (congestive) heart failure: Secondary | ICD-10-CM | POA: Diagnosis not present

## 2022-12-22 LAB — COMPREHENSIVE METABOLIC PANEL
ALT: 25 U/L (ref 0–44)
AST: 21 U/L (ref 15–41)
Albumin: 3.7 g/dL (ref 3.5–5.0)
Alkaline Phosphatase: 64 U/L (ref 38–126)
Anion gap: 9 (ref 5–15)
BUN: 37 mg/dL — ABNORMAL HIGH (ref 8–23)
CO2: 24 mmol/L (ref 22–32)
Calcium: 9 mg/dL (ref 8.9–10.3)
Chloride: 105 mmol/L (ref 98–111)
Creatinine, Ser: 1.37 mg/dL — ABNORMAL HIGH (ref 0.61–1.24)
GFR, Estimated: 48 mL/min — ABNORMAL LOW (ref >60)
Glucose, Bld: 146 mg/dL — ABNORMAL HIGH (ref 70–99)
Potassium: 4.4 mmol/L (ref 3.5–5.1)
Sodium: 138 mmol/L (ref 135–145)
Total Bilirubin: 0.9 mg/dL (ref 0.3–1.2)
Total Protein: 7.3 g/dL (ref 6.5–8.1)

## 2022-12-22 LAB — CBC WITH DIFFERENTIAL/PLATELET
Abs Immature Granulocytes: 0.05 10*3/uL (ref 0.00–0.07)
Basophils Absolute: 0 10*3/uL (ref 0.0–0.1)
Basophils Relative: 0 %
Eosinophils Absolute: 0 10*3/uL (ref 0.0–0.5)
Eosinophils Relative: 0 %
HCT: 38.6 % — ABNORMAL LOW (ref 39.0–52.0)
Hemoglobin: 12.9 g/dL — ABNORMAL LOW (ref 13.0–17.0)
Immature Granulocytes: 1 %
Lymphocytes Relative: 11 %
Lymphs Abs: 1.2 10*3/uL (ref 0.7–4.0)
MCH: 30.7 pg (ref 26.0–34.0)
MCHC: 33.4 g/dL (ref 30.0–36.0)
MCV: 91.9 fL (ref 80.0–100.0)
Monocytes Absolute: 1.2 10*3/uL — ABNORMAL HIGH (ref 0.1–1.0)
Monocytes Relative: 11 %
Neutro Abs: 8.5 10*3/uL — ABNORMAL HIGH (ref 1.7–7.7)
Neutrophils Relative %: 77 %
Platelets: 143 10*3/uL — ABNORMAL LOW (ref 150–400)
RBC: 4.2 MIL/uL — ABNORMAL LOW (ref 4.22–5.81)
RDW: 13.7 % (ref 11.5–15.5)
WBC: 11 10*3/uL — ABNORMAL HIGH (ref 4.0–10.5)
nRBC: 0 % (ref 0.0–0.2)

## 2022-12-22 LAB — URINALYSIS, ROUTINE W REFLEX MICROSCOPIC
Bacteria, UA: NONE SEEN
Bilirubin Urine: NEGATIVE
Glucose, UA: 50 mg/dL — AB
Hgb urine dipstick: NEGATIVE
Ketones, ur: NEGATIVE mg/dL
Leukocytes,Ua: NEGATIVE
Nitrite: NEGATIVE
Protein, ur: 30 mg/dL — AB
Specific Gravity, Urine: 1.014 (ref 1.005–1.030)
pH: 6 (ref 5.0–8.0)

## 2022-12-22 LAB — CBG MONITORING, ED
Glucose-Capillary: 145 mg/dL — ABNORMAL HIGH (ref 70–99)
Glucose-Capillary: 146 mg/dL — ABNORMAL HIGH (ref 70–99)

## 2022-12-22 MED ORDER — LACTATED RINGERS IV SOLN: INTRAVENOUS | Status: DC

## 2022-12-22 MED ORDER — HEPARIN SODIUM (PORCINE) 5000 UNIT/ML IJ SOLN
5000.0000 [IU] | Freq: Three times a day (TID) | INTRAMUSCULAR | Status: DC
  Administered 2022-12-23 – 2022-12-27 (×13): 5000 [IU] via SUBCUTANEOUS
  Filled 2022-12-22 (×13): qty 1

## 2022-12-22 MED ORDER — SODIUM CHLORIDE 0.9 % IV BOLUS
1000.0000 mL | Freq: Once | INTRAVENOUS | Status: AC
  Administered 2022-12-22: 1000 mL via INTRAVENOUS

## 2022-12-22 MED ORDER — HYDRALAZINE HCL 20 MG/ML IJ SOLN
5.0000 mg | INTRAMUSCULAR | Status: DC | PRN
  Administered 2022-12-25 – 2022-12-26 (×2): 5 mg via INTRAVENOUS
  Filled 2022-12-22 (×2): qty 1

## 2022-12-22 MED ORDER — CARBIDOPA-LEVODOPA 25-100 MG PO TABS
2.0000 | ORAL_TABLET | Freq: Every day | ORAL | Status: DC
  Filled 2022-12-22 (×2): qty 2

## 2022-12-22 NOTE — ED Notes (Signed)
ED TO INPATIENT HANDOFF REPORT  ED Nurse Name and Phone #: Maryla Morrow Name/Age/Gender Jose Gilbert 89 y.o. male Room/Bed: APA19/APA19  Code Status   Code Status: DNR  Home/SNF/Other Nursing Home Patient oriented to: self Is this baseline? Yes   Triage Complete: Triage complete  Chief Complaint AMS (altered mental status) [R41.82]  Triage Note Pt had fall Sunday and has been altered since. Pt was seen for the fall here Sunday.   Allergies Allergies  Allergen Reactions   Benadryl [Diphenhydramine] Other (See Comments)    UNK reaction ( not on MAR)   Imdur [Isosorbide Nitrate] Other (See Comments)    UNK reaction   Iodinated Contrast Media Other (See Comments)    UNK reaction   Mirtazapine Other (See Comments)    UNK reaction   Penicillins Other (See Comments)    UNK reaction   Shellfish Allergy Other (See Comments)    Unk reaction   Tramadol Other (See Comments)    UNK reaction   Trazodone Rash    Level of Care/Admitting Diagnosis ED Disposition     ED Disposition  Admit   Condition  --   Comment  Hospital Area: [facility] [[id number]]  Level of Care: Med-Surg [16]  Covid Evaluation: Asymptomatic - no recent exposure (last 10 days) testing not required  Diagnosis: AMS (altered mental status) [1610960]  Admitting Physician: Chiquita Loth  Attending Physician: Randol Kern, DAWOOD S [[id number]]          B Medical/Surgery History Past Medical History:  Diagnosis Date   Atherosclerotic heart disease of native coronary artery without angina pectoris    CAD (coronary artery disease) 12/31/2021   CKD (chronic kidney disease)    Constipation    Dementia (HCC)    GERD (gastroesophageal reflux disease)    History of falling    Hyperlipemia    Hypertension    Insomnia    MDD (major depressive disorder)    Parkinson's disease    PVD (peripheral vascular disease) (HCC)    History reviewed. No pertinent surgical history.   A IV  Location/Drains/Wounds Patient Lines/Drains/Airways Status     Active Line/Drains/Airways     Name Placement date Placement time Site Days   Peripheral IV 12/22/22 20 G 1.25" Left Antecubital 12/22/22  1557  Antecubital  less than 1            Intake/Output Last 24 hours  Intake/Output Summary (Last 24 hours) at 12/22/2022 2116 Last data filed at 12/22/2022 1927 Gross per 24 hour  Intake 1000 ml  Output 1100 ml  Net -100 ml    Labs/Imaging Results for orders placed or performed during the hospital encounter of 12/22/22 (from the past 48 hour(s))  CBG monitoring, ED     Status: Abnormal   Collection Time: 12/22/22  2:44 PM  Result Value Ref Range   Glucose-Capillary 145 (H) 70 - 99 mg/dL    Comment: Glucose reference range applies only to samples taken after fasting for at least 8 hours.  CBG monitoring, ED     Status: Abnormal   Collection Time: 12/22/22  3:08 PM  Result Value Ref Range   Glucose-Capillary 146 (H) 70 - 99 mg/dL    Comment: Glucose reference range applies only to samples taken after fasting for at least 8 hours.  CBC with Differential     Status: Abnormal   Collection Time: 12/22/22  3:46 PM  Result Value Ref Range   WBC 11.0 (H) 4.0 -  10.5 K/uL   RBC 4.20 (L) 4.22 - 5.81 MIL/uL   Hemoglobin 12.9 (L) 13.0 - 17.0 g/dL   HCT 40.9 (L) 81.1 - 91.4 %   MCV 91.9 80.0 - 100.0 fL   MCH 30.7 26.0 - 34.0 pg   MCHC 33.4 30.0 - 36.0 g/dL   RDW 78.2 95.6 - 21.3 %   Platelets 143 (L) 150 - 400 K/uL   nRBC 0.0 0.0 - 0.2 %   Neutrophils Relative % 77 %   Neutro Abs 8.5 (H) 1.7 - 7.7 K/uL   Lymphocytes Relative 11 %   Lymphs Abs 1.2 0.7 - 4.0 K/uL   Monocytes Relative 11 %   Monocytes Absolute 1.2 (H) 0.1 - 1.0 K/uL   Eosinophils Relative 0 %   Eosinophils Absolute 0.0 0.0 - 0.5 K/uL   Basophils Relative 0 %   Basophils Absolute 0.0 0.0 - 0.1 K/uL   Immature Granulocytes 1 %   Abs Immature Granulocytes 0.05 0.00 - 0.07 K/uL    Comment: Performed at [facility], [address], [place], [place] [address]  Comprehensive metabolic panel     Status: Abnormal   Collection Time: 12/22/22  3:46 PM  Result Value Ref Range   Sodium 138 135 - 145 mmol/L   Potassium 4.4 3.5 - 5.1 mmol/L   Chloride 105 98 - 111 mmol/L   CO2 24 22 - 32 mmol/L   Glucose, Bld 146 (H) 70 - 99 mg/dL    Comment: Glucose reference range applies only to samples taken after fasting for at least 8 hours.   BUN 37 (H) 8 - 23 mg/dL   Creatinine, Ser 8.46 (H) 0.61 - 1.24 mg/dL   Calcium 9.0 8.9 - 96.2 mg/dL   Total Protein 7.3 6.5 - 8.1 g/dL   Albumin 3.7 3.5 - 5.0 g/dL   AST 21 15 - 41 U/L   ALT 25 0 - 44 U/L   Alkaline Phosphatase 64 38 - 126 U/L   Total Bilirubin 0.9 0.3 - 1.2 mg/dL   GFR, Estimated 48 (L) >60 mL/min    Comment: (NOTE) Calculated using the CKD-EPI Creatinine Equation (2021)    Anion gap 9 5 - 15    Comment: Performed at [facility], [address], [place], [place] [address]  Urinalysis, Routine w reflex microscopic -Urine, Clean Catch     Status: Abnormal   Collection Time: 12/22/22  4:37 PM  Result Value Ref Range   Color, Urine YELLOW YELLOW   APPearance CLEAR CLEAR   Specific Gravity, Urine 1.014 1.005 - 1.030   pH 6.0 5.0 - 8.0   Glucose, UA 50 (A) NEGATIVE mg/dL   Hgb urine dipstick NEGATIVE NEGATIVE   Bilirubin Urine NEGATIVE NEGATIVE   Ketones, ur NEGATIVE NEGATIVE mg/dL   Protein, ur 30 (A) NEGATIVE mg/dL   Nitrite NEGATIVE NEGATIVE   Leukocytes,Ua NEGATIVE NEGATIVE   RBC / HPF 0-5 0 - 5 RBC/hpf   WBC, UA 0-5 0 - 5 WBC/hpf   Bacteria, UA NONE SEEN NONE SEEN   Squamous Epithelial / HPF 0-5 0 - 5 /HPF   Mucus PRESENT     Comment: Performed at [facility] [address], [place], [place] [address]   DG Chest Portable 1 View  Result Date: 12/22/2022 CLINICAL DATA:  Fall several days ago with altered mental status EXAM: PORTABLE CHEST 1 VIEW COMPARISON:  Chest radiograph dated 12/19/2022 FINDINGS: Normal lung volumes. No focal  consolidations. No pleural effusion or pneumothorax. Similar  cardiomediastinal silhouette. Median sternotomy wires are nondisplaced. IMPRESSION: No active disease. Electronically Signed   By: Agustin Cree M.D.   On: 12/22/2022 16:32   CT Cervical Spine Wo Contrast  Result Date: 12/22/2022 CLINICAL DATA:  Polytrauma, blunt Recent fractures of C1 and C2 EXAM: CT CERVICAL SPINE WITHOUT CONTRAST TECHNIQUE: Multidetector CT imaging of the cervical spine was performed without intravenous contrast. Multiplanar CT image reconstructions were also generated. RADIATION DOSE REDUCTION: This exam was performed according to the departmental dose-optimization program which includes automated exposure control, adjustment of the mA and/or kV according to patient size and/or use of iterative reconstruction technique. COMPARISON:  CT 3 days ago 12/19/2022 FINDINGS: Alignment: Stable from prior exam. Slight increasing displacement of the anterior type 2 dens fracture, similar to mm displacement posteriorly. Degenerative anterolisthesis of C4 on C5 and C7 on T1, stable. Exaggerated upper thoracic kyphosis and straightening of normal lordosis. No jumped or perched facets. Skull base and vertebrae: Again seen type 2 dens fracture. There is 2-3 mm posterior displacement of the tip of the dens with respect to the body, unchanged. There is slight increased distraction anteriorly. Unchanged fractures through the right and left aspect of arch of C1. There are no new fractures. Soft tissues and spinal canal: Mild thickening at the level of the dens fracture but no significant canal hematoma. Disc levels: Stable multilevel degenerative disc disease and facet hypertrophy from recent prior. Upper chest: No acute or unexpected findings. Other: None. IMPRESSION: 1. Known type 2 dens fracture. Stable degree of posterior displacement, however there is slight increased distraction anteriorly. 2. Unchanged fractures through the right and left aspect of  the arch of C1. 3. No new fractures. Electronically Signed   By: Narda Rutherford M.D.   On: 12/22/2022 15:44   CT Head Wo Contrast  Result Date: 12/22/2022 CLINICAL DATA:  Mental status change, unknown cause EXAM: CT HEAD WITHOUT CONTRAST TECHNIQUE: Contiguous axial images were obtained from the base of the skull through the vertex without intravenous contrast. RADIATION DOSE REDUCTION: This exam was performed according to the departmental dose-optimization program which includes automated exposure control, adjustment of the mA and/or kV according to patient size and/or use of iterative reconstruction technique. COMPARISON:  Head CT 12/19/2022 FINDINGS: Brain: Stable degree of atrophy and chronic small vessel ischemia. No intracranial hemorrhage, mass effect, or midline shift. No hydrocephalus. The basilar cisterns are patent. No evidence of territorial infarct or acute ischemia. No extra-axial or intracranial fluid collection. Vascular: Atherosclerosis of skullbase vasculature without hyperdense vessel or abnormal calcification. Skull: No fracture or focal lesion. Sinuses/Orbits: No acute findings. Remote right nasal bone fracture. No mastoid effusion. Other: Diminishing frontal scalp hematoma. Right scalp skin staples in place. IMPRESSION: 1. No acute intracranial abnormality. 2. Stable atrophy and chronic small vessel ischemia. Electronically Signed   By: Narda Rutherford M.D.   On: 12/22/2022 15:38    Pending Labs Unresulted Labs (From admission, onward)     Start     Ordered   12/23/22 0500  Magnesium  Tomorrow morning,   R        12/22/22 2033   12/23/22 0500  Phosphorus  Tomorrow morning,   R        12/22/22 2033   12/23/22 0500  Ammonia  Tomorrow morning,   R        12/22/22 2034   Signed and Held  Basic metabolic panel  Tomorrow morning,   R        Signed and Held  Signed and Held  CBC  Tomorrow morning,   R        Signed and Held            Vitals/Pain Today's Vitals    12/22/22 1800 12/22/22 1830 12/22/22 1900 12/22/22 2000  BP: (!) 184/87 (!) 158/85 (!) 178/76   Pulse:      Resp: (!) 23 15 (!) 24   Temp:    98.2 F (36.8 C)  TempSrc:    Axillary  SpO2:  98%    Weight:      Height:      PainSc:        Isolation Precautions No active isolations  Medications Medications  lactated ringers infusion ( Intravenous New Bag/Given 12/22/22 1959)  sodium chloride 0.9 % bolus 1,000 mL (0 mLs Intravenous Stopped 12/22/22 1927)    Mobility non-ambulatory     Focused Assessments Cardiac Assessment Handoff:  Cardiac Rhythm: Atrial fibrillation No results found for: "CKTOTAL", "CKMB", "CKMBINDEX", "TROPONINI" No results found for: "DDIMER" Does the Patient currently have chest pain? No    R Recommendations: See Admitting Provider Note  Report given to:   Additional Notes: palliative consult, attempted NG unsuccessful MD aware, no need for reattempt at this time

## 2022-12-22 NOTE — ED Provider Notes (Signed)
[facility] AT [facility] Provider Note   CSN: [id number] Arrival date & time: 12/22/22  1432     History  Chief Complaint  Patient presents with   Altered Mental Status    Jose Gilbert is a 89 y.o. male.  Pt is a 89 yo male with pmhx significant for Parkinson's disease, HTN, CAD, MDD, CKD, dementia, GERD, HLD, and PVD.  He fell and was seen in the ED on 5/5.  Upon review of his visit, it was noted that he had a type 2 odontoid fx with mild displacement/distraction and fx through the lateral aspects of C1 ring bilaterally.  He was d/c back to the SNF with instructions to f/u with Dr. Maisie Fus in 2 weeks.  Pt was d/c with a c-collar.  Pt was brought back to the ED today because he's been altered.  He is unable to contribute to the hx.         Home Medications Prior to Admission medications   Medication Sig Start Date End Date Taking? Authorizing Provider  acetaminophen (TYLENOL) 325 MG tablet Take 650 mg by mouth every 8 (eight) hours as needed for mild pain.    [provider]  BELSOMRA 5 MG TABS Take 5 mg by mouth at bedtime. 11/25/21   [provider]  carbidopa-levodopa (SINEMET IR) 25-100 MG tablet Take 2 tablets by mouth in the morning. 12/19/21   [provider]  nitroGLYCERIN (NITROSTAT) 0.4 MG SL tablet Place 0.4 mg under the tongue See admin instructions. Every 15 minutes as needed for chest pain 10/16/21   [provider]  polyethylene glycol (MIRALAX / GLYCOLAX) 17 g packet Take 17 g by mouth every 6 (six) hours as needed for mild constipation.    [provider]  senna-docusate (SENOKOT-S) 8.6-50 MG tablet Take 1 tablet by mouth at bedtime.    [provider]  simvastatin (ZOCOR) 80 MG tablet Take 40 mg by mouth at bedtime. 12/18/21   [provider]  venlafaxine XR (EFFEXOR-XR) 75 MG 24 hr capsule Take 225 mg by mouth at bedtime. 10/23/21   [provider]  Zinc Oxide  (BOUDREAUXS BUTT PASTE) 16 % OINT Apply 1 Application topically 3 (three) times daily. Needs to be placed in the buttocks area where there is skin breakdown 3 times a day 01/25/22   Gwyneth Sprout, MD      Allergies    Benadryl [diphenhydramine], Imdur [isosorbide nitrate], Iodinated contrast media, Mirtazapine, Penicillins, Shellfish allergy, Tramadol, and Trazodone    Review of Systems   Review of Systems  Unable to perform ROS: Dementia  All other systems reviewed and are negative.   Physical Exam Updated Vital Signs BP (!) 158/85   Pulse 87   Temp 97.7 F (36.5 C) (Axillary)   Resp 15   Ht 5\' 8"  (1.727 m)   Wt 63.5 kg   SpO2 98%   BMI 21.29 kg/m  Physical Exam Vitals and nursing note reviewed.  Constitutional:      Appearance: Normal appearance.  HENT:     Head: Normocephalic.     Comments: Contusions to head    Right Ear: External ear normal.     Left Ear: External ear normal.     Nose: Nose normal.     Mouth/Throat:     Mouth: Mucous membranes are dry.  Eyes:     Extraocular Movements: Extraocular movements intact.     Pupils: Pupils are equal, round, and reactive to  light.  Neck:     Comments: In c-collar Cardiovascular:     Rate and Rhythm: Normal rate and regular rhythm.     Pulses: Normal pulses.     Heart sounds: Normal heart sounds.  Pulmonary:     Effort: Pulmonary effort is normal.     Breath sounds: Normal breath sounds.  Abdominal:     General: Abdomen is flat. Bowel sounds are normal.     Palpations: Abdomen is soft.  Musculoskeletal:        General: Normal range of motion.  Skin:    General: Skin is warm.     Capillary Refill: Capillary refill takes less than 2 seconds.  Neurological:     Mental Status: He is alert. He is disoriented.     Comments: Pt sleeping and will open an eyelid briefly, but will then go back to sleep.  He is not following any commands.  Psychiatric:     Comments: Unable to assess     ED Results / Procedures /  Treatments   Labs (all labs ordered are listed, but only abnormal results are displayed) Labs Reviewed  CBC WITH DIFFERENTIAL/PLATELET - Abnormal; Notable for the following components:      Result Value   WBC 11.0 (*)    RBC 4.20 (*)    Hemoglobin 12.9 (*)    HCT 38.6 (*)    Platelets 143 (*)    Neutro Abs 8.5 (*)    Monocytes Absolute 1.2 (*)    All other components within normal limits  COMPREHENSIVE METABOLIC PANEL - Abnormal; Notable for the following components:   Glucose, Bld 146 (*)    BUN 37 (*)    Creatinine, Ser 1.37 (*)    GFR, Estimated 48 (*)    All other components within normal limits  URINALYSIS, ROUTINE W REFLEX MICROSCOPIC - Abnormal; Notable for the following components:   Glucose, UA 50 (*)    Protein, ur 30 (*)    All other components within normal limits  CBG MONITORING, ED - Abnormal; Notable for the following components:   Glucose-Capillary 145 (*)    All other components within normal limits  CBG MONITORING, ED - Abnormal; Notable for the following components:   Glucose-Capillary 146 (*)    All other components within normal limits    EKG EKG Interpretation  Date/Time:  Wednesday Dec 22 2022 15:06:49 EDT Ventricular Rate:  79 PR Interval:  196 QRS Duration: 92 QT Interval:  413 QTC Calculation: 474 R Axis:   -42 Text Interpretation: Sinus rhythm Atrial premature complexes Left axis deviation Abnormal R-wave progression, early transition No significant change since last tracing Confirmed by Jacalyn Lefevre [phone]) on 12/22/2022 3:33:18 PM  Radiology DG Chest Portable 1 View  Result Date: 12/22/2022 CLINICAL DATA:  Fall several days ago with altered mental status EXAM: PORTABLE CHEST 1 VIEW COMPARISON:  Chest radiograph dated 12/19/2022 FINDINGS: Normal lung volumes. No focal consolidations. No pleural effusion or pneumothorax. Similar cardiomediastinal silhouette. Median sternotomy wires are nondisplaced. IMPRESSION: No active disease.  Electronically Signed   By: Agustin Cree M.D.   On: 12/22/2022 16:32   CT Cervical Spine Wo Contrast  Result Date: 12/22/2022 CLINICAL DATA:  Polytrauma, blunt Recent fractures of C1 and C2 EXAM: CT CERVICAL SPINE WITHOUT CONTRAST TECHNIQUE: Multidetector CT imaging of the cervical spine was performed without intravenous contrast. Multiplanar CT image reconstructions were also generated. RADIATION DOSE REDUCTION: This exam was performed according to the departmental dose-optimization program which includes automated  exposure control, adjustment of the mA and/or kV according to patient size and/or use of iterative reconstruction technique. COMPARISON:  CT 3 days ago 12/19/2022 FINDINGS: Alignment: Stable from prior exam. Slight increasing displacement of the anterior type 2 dens fracture, similar to mm displacement posteriorly. Degenerative anterolisthesis of C4 on C5 and C7 on T1, stable. Exaggerated upper thoracic kyphosis and straightening of normal lordosis. No jumped or perched facets. Skull base and vertebrae: Again seen type 2 dens fracture. There is 2-3 mm posterior displacement of the tip of the dens with respect to the body, unchanged. There is slight increased distraction anteriorly. Unchanged fractures through the right and left aspect of arch of C1. There are no new fractures. Soft tissues and spinal canal: Mild thickening at the level of the dens fracture but no significant canal hematoma. Disc levels: Stable multilevel degenerative disc disease and facet hypertrophy from recent prior. Upper chest: No acute or unexpected findings. Other: None. IMPRESSION: 1. Known type 2 dens fracture. Stable degree of posterior displacement, however there is slight increased distraction anteriorly. 2. Unchanged fractures through the right and left aspect of the arch of C1. 3. No new fractures. Electronically Signed   By: Narda Rutherford M.D.   On: 12/22/2022 15:44   CT Head Wo Contrast  Result Date:  12/22/2022 CLINICAL DATA:  Mental status change, unknown cause EXAM: CT HEAD WITHOUT CONTRAST TECHNIQUE: Contiguous axial images were obtained from the base of the skull through the vertex without intravenous contrast. RADIATION DOSE REDUCTION: This exam was performed according to the departmental dose-optimization program which includes automated exposure control, adjustment of the mA and/or kV according to patient size and/or use of iterative reconstruction technique. COMPARISON:  Head CT 12/19/2022 FINDINGS: Brain: Stable degree of atrophy and chronic small vessel ischemia. No intracranial hemorrhage, mass effect, or midline shift. No hydrocephalus. The basilar cisterns are patent. No evidence of territorial infarct or acute ischemia. No extra-axial or intracranial fluid collection. Vascular: Atherosclerosis of skullbase vasculature without hyperdense vessel or abnormal calcification. Skull: No fracture or focal lesion. Sinuses/Orbits: No acute findings. Remote right nasal bone fracture. No mastoid effusion. Other: Diminishing frontal scalp hematoma. Right scalp skin staples in place. IMPRESSION: 1. No acute intracranial abnormality. 2. Stable atrophy and chronic small vessel ischemia. Electronically Signed   By: Narda Rutherford M.D.   On: 12/22/2022 15:38    Procedures Procedures    Medications Ordered in ED Medications  sodium chloride 0.9 % bolus 1,000 mL (1,000 mLs Intravenous New Bag/Given 12/22/22 1557)    ED Course/ Medical Decision Making/ A&P                             Medical Decision Making Amount and/or Complexity of Data Reviewed Labs: ordered. Radiology: ordered.  Risk Decision regarding hospitalization.   This patient presents to the ED for concern of ams, this involves an extensive number of treatment options, and is a complaint that carries with it a high risk of complications and morbidity.  The differential diagnosis includes dehydration, electrolyte abn, head  injury   Co morbidities that complicate the patient evaluation  Parkinson's disease, HTN, CAD, MDD, CKD, dementia, GERD, HLD, and PVD   Additional history obtained:  Additional history obtained from epic chart review External records from outside source obtained and reviewed including EMS report   Lab Tests:  I Ordered, and personally interpreted labs.  The pertinent results include:  cmp with cr 1.37 (stable), ua +  glucose/protein, cbc nl other than mild thrombocytopenia 143 (chronic)   Imaging Studies ordered:  I ordered imaging studies including CT head/CT c-spine  I independently visualized and interpreted imaging which showed  CT head: 1. No acute intracranial abnormality.  2. Stable atrophy and chronic small vessel ischemia.  CXR: No active disease.  CT c-spine: Known type 2 dens fracture. Stable degree of posterior  displacement, however there is slight increased distraction  anteriorly.  2. Unchanged fractures through the right and left aspect of the arch  of C1.  3. No new fractures.   I agree with the radiologist interpretation   Cardiac Monitoring:  The patient was maintained on a cardiac monitor.  I personally viewed and interpreted the cardiac monitored which showed an underlying rhythm of: nsr   Medicines ordered and prescription drug management:  I ordered medication including ivfs  for dehydration  Reevaluation of the patient after these medicines showed that the patient improved I have reviewed the patients home medicines and have made adjustments as needed   Test Considered:  ct   Critical Interventions:  ivfs   Consultations Obtained:  I requested consultation with the hospitalist (Dr. Randol Kern),  and discussed lab and imaging findings as well as pertinent plan -he will admit   Problem List / ED Course:  Encephalopathy:  unclear etiology.  It is also unclear what his baseline ms is.  No new findings on CT head or c-spine.   Possible polypharmacy vs concussion.  He will need observation as he continues to sleep and is not eating/drinking/following any commands. C1 and C2 fx:  nothing new.  Continue the c-collar and f/u with Dr. Maisie Fus ([facility]) as an outpatient.   Reevaluation:  After the interventions noted above, I reevaluated the patient and found that they have :stayed the same   Social Determinants of Health:  Lives in SNF   Dispostion:  After consideration of the diagnostic results and the patients response to treatment, I feel that the patent would benefit from admission.          Final Clinical Impression(s) / ED Diagnoses Final diagnoses:  Encephalomyopathy  Dehydration  Closed odontoid fracture, initial encounter (HCC)  Closed nondisplaced fracture of first cervical vertebra, unspecified fracture morphology, initial encounter [facility])    Rx / DC Orders ED Discharge Orders     None         Jacalyn Lefevre, MD 12/22/22 1909

## 2022-12-22 NOTE — ED Notes (Signed)
Called and spoke with daughter Elita Quick and updated her on plan of care

## 2022-12-22 NOTE — H&P (Signed)
TRH H&P   Patient Demographics:    Jose Gilbert, is a 89 y.o. male  MRN: [id number]   DOB - 01-09-1928  Admit Date - 12/22/2022  Outpatient Primary MD for the patient is [place], Christella Hartigan  Referring MD/NP/PA: Dr Particia Nearing  Patient coming from: SNF  Chief Complaint  Patient presents with   Altered Mental Status      HPI:    Jose Gilbert  is a 89 y.o. male,  with medical history significant for CAD, Parkinson disease, hypertension, and depression, dementia, close presenting from the setting home for evaluation of altered mental status, patient had fall at SNF on 5/5, sent to ED for evaluation, during that visit he was diagnosed with type II odontoid fracture with mild displacement/distraction and fracture, recommendation has been made for outpatient neurology workup and c-collar, patient was discharged back to SNF, with instruction to follow-up with Dr. Maisie Fus neurosurgery in 2 weeks, patient was brought back to the ED by SNF, as he remains altered, unable to eat or drink, there is no documentation from facility for any fever.  Daughter reports patient with significant dementia at baseline, but he was ambulatory prior to his fall. -In ED imaging has been repeated including CT head and cervical spine, showing stable odontoid fracture, no acute finding and CT head, labs significant for baseline creatinine 1.37, white blood cell count at 11, UA is negative, checks x-ray with no acute findings, given his significant encephalopathy, [facility] consulted to admit.    Review of systems:    Patient with advanced dementia, unable to provide any review of systems or answer any questions  With Past History of the following :    Past Medical History:  Diagnosis Date   Atherosclerotic heart disease of native coronary artery without angina pectoris    CAD (coronary artery disease) 12/31/2021    CKD (chronic kidney disease)    Constipation    Dementia (HCC)    GERD (gastroesophageal reflux disease)    History of falling    Hyperlipemia    Hypertension    Insomnia    MDD (major depressive disorder)    Parkinson's disease    PVD (peripheral vascular disease) (HCC)       History reviewed. No pertinent surgical history.    Social History:     Social History   Tobacco Use   Smoking status: Unknown   Smokeless tobacco: Not on file  Substance Use Topics   Alcohol use: Not Currently       Family History :    History reviewed. No pertinent family history.    Home Medications:   Prior to Admission medications   Medication Sig Start Date End Date Taking? Authorizing Provider  acetaminophen (TYLENOL) 325 MG tablet Take 650 mg by mouth every 8 (eight) hours as needed for mild pain.   Yes [provider]  BELSOMRA 5 MG TABS  Take 5 mg by mouth at bedtime. 11/25/21  Yes [provider]  carbidopa-levodopa (SINEMET IR) 25-100 MG tablet Take 2 tablets by mouth in the morning. 12/19/21  Yes [provider]  nitroGLYCERIN (NITROSTAT) 0.4 MG SL tablet Place 0.4 mg under the tongue See admin instructions. 10/16/21  Yes [provider]  polyethylene glycol (MIRALAX / GLYCOLAX) 17 g packet Take 17 g by mouth every 6 (six) hours as needed for mild constipation.   Yes [provider]  senna-docusate (SENOKOT-S) 8.6-50 MG tablet Take 1 tablet by mouth at bedtime.   Yes [provider]  simvastatin (ZOCOR) 80 MG tablet Take 40 mg by mouth at bedtime. 12/18/21  Yes [provider]  venlafaxine XR (EFFEXOR-XR) 75 MG 24 hr capsule Take 225 mg by mouth at bedtime. 10/23/21  Yes [provider]     Allergies:     Allergies  Allergen Reactions   Benadryl [Diphenhydramine] Other (See Comments)    UNK reaction ( not on MAR)   Imdur [Isosorbide Nitrate] Other (See Comments)    UNK reaction   Iodinated Contrast Media Other  (See Comments)    UNK reaction   Mirtazapine Other (See Comments)    UNK reaction   Penicillins Other (See Comments)    UNK reaction   Shellfish Allergy Other (See Comments)    Unk reaction   Tramadol Other (See Comments)    UNK reaction   Trazodone Rash     Physical Exam:   Vitals  Blood pressure (!) 178/76, pulse 87, temperature 98.2 F (36.8 C), temperature source Axillary, resp. rate (!) 24, height 5\' 8"  (1.727 m), weight 63.5 kg, SpO2 98 %.   1. General extremely frail, ill-appearing, cachectic male laying in bed in c-collar  2.  Underwent, difficult to arouse, unable to answer any questions or follow any commands  3.  HEENT is wearing c-collar's  4.  Dry oral mucosa.  6. Symmetrical Chest wall movement, Good air movement bilaterally, CTAB.  7. RRR, No Gallops, Rubs or Murmurs, No Parasternal Heave.  8. Positive Bowel Sounds, Abdomen Soft, No tenderness, No organomegaly appriciated,No rebound -guarding or rigidity.  9.  No Cyanosis, slow skin Turgor, No Skin Rash or Bruise.  10.  Muscle wasting     Data Review:    CBC Recent Labs  Lab 12/19/22 2207 12/22/22 1546  WBC 4.7 11.0*  HGB 11.2* 12.9*  HCT 34.0* 38.6*  PLT 119* 143*  MCV 92.9 91.9  MCH 30.6 30.7  MCHC 32.9 33.4  RDW 13.3 13.7  LYMPHSABS 1.5 1.2  MONOABS 0.7 1.2*  EOSABS 0.2 0.0  BASOSABS 0.0 0.0   ------------------------------------------------------------------------------------------------------------------  Chemistries  Recent Labs  Lab 12/19/22 2207 12/22/22 1546  NA 136 138  K 3.9 4.4  CL 104 105  CO2 25 24  GLUCOSE 166* 146*  BUN 29* 37*  CREATININE 1.38* 1.37*  CALCIUM 8.3* 9.0  AST 30 21  ALT 14 25  ALKPHOS 73 64  BILITOT 0.6 0.9   ------------------------------------------------------------------------------------------------------------------ estimated creatinine clearance is 30.9 mL/min (A) (by C-G formula based on SCr of 1.37 mg/dL  (H)). ------------------------------------------------------------------------------------------------------------------ No results for input(s): "TSH", "T4TOTAL", "T3FREE", "THYROIDAB" in the last 72 hours.  Invalid input(s): "FREET3"  Coagulation profile No results for input(s): "INR", "PROTIME" in the last 168 hours. ------------------------------------------------------------------------------------------------------------------- No results for input(s): "DDIMER" in the last 72 hours. -------------------------------------------------------------------------------------------------------------------  Cardiac Enzymes No results for input(s): "CKMB", "TROPONINI", "MYOGLOBIN" in the last 168 hours.  Invalid input(s): "CK" ------------------------------------------------------------------------------------------------------------------  No results found for: "BNP"   ---------------------------------------------------------------------------------------------------------------  Urinalysis    Component Value Date/Time   COLORURINE YELLOW 12/22/2022 1637   APPEARANCEUR CLEAR 12/22/2022 1637   LABSPEC 1.014 12/22/2022 1637   PHURINE 6.0 12/22/2022 1637   GLUCOSEU 50 (A) 12/22/2022 1637   HGBUR NEGATIVE 12/22/2022 1637   BILIRUBINUR NEGATIVE 12/22/2022 1637   KETONESUR NEGATIVE 12/22/2022 1637   PROTEINUR 30 (A) 12/22/2022 1637   NITRITE NEGATIVE 12/22/2022 1637   LEUKOCYTESUR NEGATIVE 12/22/2022 1637    ----------------------------------------------------------------------------------------------------------------   Imaging Results:    DG Chest Portable 1 View  Result Date: 12/22/2022 CLINICAL DATA:  Fall several days ago with altered mental status EXAM: PORTABLE CHEST 1 VIEW COMPARISON:  Chest radiograph dated 12/19/2022 FINDINGS: Normal lung volumes. No focal consolidations. No pleural effusion or pneumothorax. Similar cardiomediastinal silhouette. Median sternotomy wires are  nondisplaced. IMPRESSION: No active disease. Electronically Signed   By: Agustin Cree M.D.   On: 12/22/2022 16:32   CT Cervical Spine Wo Contrast  Result Date: 12/22/2022 CLINICAL DATA:  Polytrauma, blunt Recent fractures of C1 and C2 EXAM: CT CERVICAL SPINE WITHOUT CONTRAST TECHNIQUE: Multidetector CT imaging of the cervical spine was performed without intravenous contrast. Multiplanar CT image reconstructions were also generated. RADIATION DOSE REDUCTION: This exam was performed according to the departmental dose-optimization program which includes automated exposure control, adjustment of the mA and/or kV according to patient size and/or use of iterative reconstruction technique. COMPARISON:  CT 3 days ago 12/19/2022 FINDINGS: Alignment: Stable from prior exam. Slight increasing displacement of the anterior type 2 dens fracture, similar to mm displacement posteriorly. Degenerative anterolisthesis of C4 on C5 and C7 on T1, stable. Exaggerated upper thoracic kyphosis and straightening of normal lordosis. No jumped or perched facets. Skull base and vertebrae: Again seen type 2 dens fracture. There is 2-3 mm posterior displacement of the tip of the dens with respect to the body, unchanged. There is slight increased distraction anteriorly. Unchanged fractures through the right and left aspect of arch of C1. There are no new fractures. Soft tissues and spinal canal: Mild thickening at the level of the dens fracture but no significant canal hematoma. Disc levels: Stable multilevel degenerative disc disease and facet hypertrophy from recent prior. Upper chest: No acute or unexpected findings. Other: None. IMPRESSION: 1. Known type 2 dens fracture. Stable degree of posterior displacement, however there is slight increased distraction anteriorly. 2. Unchanged fractures through the right and left aspect of the arch of C1. 3. No new fractures. Electronically Signed   By: Narda Rutherford M.D.   On: 12/22/2022 15:44   CT  Head Wo Contrast  Result Date: 12/22/2022 CLINICAL DATA:  Mental status change, unknown cause EXAM: CT HEAD WITHOUT CONTRAST TECHNIQUE: Contiguous axial images were obtained from the base of the skull through the vertex without intravenous contrast. RADIATION DOSE REDUCTION: This exam was performed according to the departmental dose-optimization program which includes automated exposure control, adjustment of the mA and/or kV according to patient size and/or use of iterative reconstruction technique. COMPARISON:  Head CT 12/19/2022 FINDINGS: Brain: Stable degree of atrophy and chronic small vessel ischemia. No intracranial hemorrhage, mass effect, or midline shift. No hydrocephalus. The basilar cisterns are patent. No evidence of territorial infarct or acute ischemia. No extra-axial or intracranial fluid collection. Vascular: Atherosclerosis of skullbase vasculature without hyperdense vessel or abnormal calcification. Skull: No fracture or focal lesion. Sinuses/Orbits: No acute findings. Remote right nasal bone fracture. No mastoid effusion. Other: Diminishing frontal scalp hematoma. Right scalp  skin staples in place. IMPRESSION: 1. No acute intracranial abnormality. 2. Stable atrophy and chronic small vessel ischemia. Electronically Signed   By: Narda Rutherford M.D.   On: 12/22/2022 15:38      Assessment & Plan:    Principal Problem:   AMS (altered mental status) Active Problems:   Parkinson's disease   Acute encephalopathy   Hypertension   CAD (coronary artery disease)   Protein-calorie malnutrition, severe   Dementia due to Parkinson's disease without behavioral disturbance (HCC)   HLD (hyperlipidemia)  Acute  encephalopathy -Patient significantly altered, confused, CT head with no acute findings. -No evidence of infection or significant labs abnormalities. -Continue with IV fluids. -Hold Belsomra -Patient with advanced dementia, but this has rapid decline as discussed with his daughter, I  have informed her we will continue with IV fluids over the next day or 2, hold sedative medications and monitor clinical progression, if no improvement then will obtain MRI brain to rule out any acute ischemic event, I have notified her as well overall given patient significant frailty, malnutrition Parkinson, advanced age and dementia, I will go ahead and consult palliative medicine regarding goals of care given difficult decline over the last few weeks. -Significantly obtundent, unsafe to swallow, so NGT has been attempted in ED, x 3, give his Parkinson medications, was unsuccessful, consult has been put to radiology to attempt in a.m.  Recent ecent fall and cervical fracture -Patient with ED visit 5/5, significant forType 2 odontoid fracture with mild displacement/distraction.  Communication has been made to continue with c-collar and outpatient neurosurgery follow-up during that ED visit. -Continue with c-collar during hospital stay  CKD IIIa  -Renal function at baseline, continue with IV fluids and hold nephrotoxic medications    Parkinson disease  - Continue Sinemet NG tube has been placed   CAD  Hyperlipidemia -Continue with statin   DVT Prophylaxis Heparin -   AM Labs Ordered, also please review Full Orders  Family Communication: Admission, patients condition and plan of care including tests being ordered have been discussed with the daughter by phone who indicate understanding and agree with the plan and Code Status.  Code Status DNR, DNR form present from facility  Likely DC to back to SNF  Condition GUARDED  Consults called: Palliative consult requested in epic  Admission status: Inpatient  Time spent in minutes : 70 minutes   Huey Bienenstock M.D on 12/22/2022 at 8:34 PM   [facility] - Office  [phone]

## 2022-12-22 NOTE — ED Triage Notes (Signed)
Pt had fall Sunday and has been altered since. Pt was seen for the fall here Sunday.

## 2022-12-22 NOTE — ED Notes (Signed)
Attempted 3 times to place NG tube, unable to pass nasal bone, passed nasal bone once and tube coiled in mouth. Pt began to have large nose bleed and required suctioning to remove blood from the back of his throat. MD notified

## 2022-12-23 ENCOUNTER — Encounter (HOSPITAL_COMMUNITY): Payer: Self-pay | Admitting: Internal Medicine

## 2022-12-23 DIAGNOSIS — N1831 Chronic kidney disease, stage 3a: Secondary | ICD-10-CM | POA: Diagnosis present

## 2022-12-23 DIAGNOSIS — E785 Hyperlipidemia, unspecified: Secondary | ICD-10-CM | POA: Diagnosis present

## 2022-12-23 DIAGNOSIS — E43 Unspecified severe protein-calorie malnutrition: Secondary | ICD-10-CM | POA: Diagnosis present

## 2022-12-23 DIAGNOSIS — I5031 Acute diastolic (congestive) heart failure: Secondary | ICD-10-CM | POA: Diagnosis not present

## 2022-12-23 DIAGNOSIS — I2510 Atherosclerotic heart disease of native coronary artery without angina pectoris: Secondary | ICD-10-CM | POA: Diagnosis present

## 2022-12-23 DIAGNOSIS — Z66 Do not resuscitate: Secondary | ICD-10-CM | POA: Diagnosis present

## 2022-12-23 DIAGNOSIS — G20A2 Parkinson's disease without dyskinesia, with fluctuations: Secondary | ICD-10-CM | POA: Diagnosis not present

## 2022-12-23 DIAGNOSIS — Z880 Allergy status to penicillin: Secondary | ICD-10-CM | POA: Diagnosis not present

## 2022-12-23 DIAGNOSIS — I130 Hypertensive heart and chronic kidney disease with heart failure and stage 1 through stage 4 chronic kidney disease, or unspecified chronic kidney disease: Secondary | ICD-10-CM | POA: Diagnosis present

## 2022-12-23 DIAGNOSIS — Z91013 Allergy to seafood: Secondary | ICD-10-CM | POA: Diagnosis not present

## 2022-12-23 DIAGNOSIS — G9341 Metabolic encephalopathy: Secondary | ICD-10-CM | POA: Diagnosis present

## 2022-12-23 DIAGNOSIS — F05 Delirium due to known physiological condition: Secondary | ICD-10-CM | POA: Diagnosis present

## 2022-12-23 DIAGNOSIS — S12001D Unspecified nondisplaced fracture of first cervical vertebra, subsequent encounter for fracture with routine healing: Secondary | ICD-10-CM | POA: Diagnosis not present

## 2022-12-23 DIAGNOSIS — W19XXXD Unspecified fall, subsequent encounter: Secondary | ICD-10-CM | POA: Diagnosis present

## 2022-12-23 DIAGNOSIS — Z7189 Other specified counseling: Secondary | ICD-10-CM

## 2022-12-23 DIAGNOSIS — F329 Major depressive disorder, single episode, unspecified: Secondary | ICD-10-CM | POA: Diagnosis present

## 2022-12-23 DIAGNOSIS — Z515 Encounter for palliative care: Secondary | ICD-10-CM | POA: Diagnosis not present

## 2022-12-23 DIAGNOSIS — R1310 Dysphagia, unspecified: Secondary | ICD-10-CM | POA: Diagnosis present

## 2022-12-23 DIAGNOSIS — Z1152 Encounter for screening for COVID-19: Secondary | ICD-10-CM | POA: Diagnosis not present

## 2022-12-23 DIAGNOSIS — R338 Other retention of urine: Secondary | ICD-10-CM | POA: Diagnosis not present

## 2022-12-23 DIAGNOSIS — I739 Peripheral vascular disease, unspecified: Secondary | ICD-10-CM | POA: Diagnosis present

## 2022-12-23 DIAGNOSIS — S12110D Anterior displaced Type II dens fracture, subsequent encounter for fracture with routine healing: Secondary | ICD-10-CM | POA: Diagnosis not present

## 2022-12-23 DIAGNOSIS — Z681 Body mass index (BMI) 19 or less, adult: Secondary | ICD-10-CM | POA: Diagnosis not present

## 2022-12-23 DIAGNOSIS — G20A1 Parkinson's disease without dyskinesia, without mention of fluctuations: Secondary | ICD-10-CM

## 2022-12-23 DIAGNOSIS — E876 Hypokalemia: Secondary | ICD-10-CM | POA: Diagnosis not present

## 2022-12-23 DIAGNOSIS — Y636 Underdosing and nonadministration of necessary drug, medicament or biological substance: Secondary | ICD-10-CM | POA: Diagnosis present

## 2022-12-23 DIAGNOSIS — R54 Age-related physical debility: Secondary | ICD-10-CM | POA: Diagnosis present

## 2022-12-23 DIAGNOSIS — T428X6A Underdosing of antiparkinsonism drugs and other central muscle-tone depressants, initial encounter: Secondary | ICD-10-CM | POA: Diagnosis present

## 2022-12-23 DIAGNOSIS — F0280 Dementia in other diseases classified elsewhere without behavioral disturbance: Secondary | ICD-10-CM

## 2022-12-23 DIAGNOSIS — E860 Dehydration: Secondary | ICD-10-CM | POA: Diagnosis present

## 2022-12-23 DIAGNOSIS — F02818 Dementia in other diseases classified elsewhere, unspecified severity, with other behavioral disturbance: Secondary | ICD-10-CM | POA: Diagnosis present

## 2022-12-23 DIAGNOSIS — I10 Essential (primary) hypertension: Secondary | ICD-10-CM

## 2022-12-23 DIAGNOSIS — R4182 Altered mental status, unspecified: Secondary | ICD-10-CM | POA: Diagnosis not present

## 2022-12-23 LAB — CBC
HCT: 36.4 % — ABNORMAL LOW (ref 39.0–52.0)
Hemoglobin: 11.9 g/dL — ABNORMAL LOW (ref 13.0–17.0)
MCH: 30.7 pg (ref 26.0–34.0)
MCHC: 32.7 g/dL (ref 30.0–36.0)
MCV: 94.1 fL (ref 80.0–100.0)
Platelets: 134 10*3/uL — ABNORMAL LOW (ref 150–400)
RBC: 3.87 MIL/uL — ABNORMAL LOW (ref 4.22–5.81)
RDW: 13.7 % (ref 11.5–15.5)
WBC: 10.8 10*3/uL — ABNORMAL HIGH (ref 4.0–10.5)
nRBC: 0 % (ref 0.0–0.2)

## 2022-12-23 LAB — PHOSPHORUS: Phosphorus: 3.2 mg/dL (ref 2.5–4.6)

## 2022-12-23 LAB — BASIC METABOLIC PANEL
Anion gap: 8 (ref 5–15)
BUN: 35 mg/dL — ABNORMAL HIGH (ref 8–23)
CO2: 23 mmol/L (ref 22–32)
Calcium: 8.6 mg/dL — ABNORMAL LOW (ref 8.9–10.3)
Chloride: 109 mmol/L (ref 98–111)
Creatinine, Ser: 1.07 mg/dL (ref 0.61–1.24)
GFR, Estimated: >60 mL/min (ref >60)
Glucose, Bld: 128 mg/dL — ABNORMAL HIGH (ref 70–99)
Potassium: 3.9 mmol/L (ref 3.5–5.1)
Sodium: 140 mmol/L (ref 135–145)

## 2022-12-23 LAB — MAGNESIUM: Magnesium: 2.4 mg/dL (ref 1.7–2.4)

## 2022-12-23 LAB — PROCALCITONIN: Procalcitonin: <0.1 ng/mL

## 2022-12-23 LAB — AMMONIA: Ammonia: <10 umol/L (ref 9–35)

## 2022-12-23 LAB — GLUCOSE, CAPILLARY: Glucose-Capillary: 141 mg/dL — ABNORMAL HIGH (ref 70–99)

## 2022-12-23 MED ORDER — CARBIDOPA-LEVODOPA 25-100 MG PO TABS
2.0000 | ORAL_TABLET | Freq: Every day | ORAL | Status: DC
  Administered 2022-12-23 – 2022-12-27 (×5): 2 via ORAL
  Filled 2022-12-23 (×7): qty 2

## 2022-12-23 NOTE — Progress Notes (Signed)
[facility] Progress Note  Patient: Jose Gilbert    GUY:[id number]  DOA: 12/22/2022    Date of Service: the patient was seen and examined on 12/23/2022  Brief hospital course: Patient is a 89 year old male with past medical history of Parkinson's disease, dementia, hypertension and depression who came from his skilled nursing facility to the ED on 5/5 after a fall and at that time, was diagnosed with a type II odontoid fracture with mild displacement/distraction fracture and patient was discharged with c-collar and outpatient neurology follow-up as well as following up with neurosurgery in 2 weeks.  Patient brought back to the emergency room on 5/8 with acute confusion, not eating or drinking and not ambulatory.  Initial lab work unremarkable, but patient remained obtunded.   Assessment and Plan: Senile dementia with behavioral disturbance and delirium/acute metabolic encephalopathy: Patient slightly more alert this afternoon and passed swallow evaluation.  Will attempt to see if palliative care to see.  Check procalcitonin to rule out infection  Parkinson's disease: Restart Sinemet once NG tube has been placed  CAD and hyperlipidemia: Restart statin once NG tube has been placed  CKD 3A: Renal function at baseline, continue gentle IV fluids.  Today, creatinine down to 1.07, much improved from even his baseline.  Recent fall with cervical fracture: Repeat films in ED note stable fracture from several days ago.  Elevated blood pressures: No history of hypertension.  As needed hydralazine.  Body mass index is 18.37 kg/m.        Consultants: Interventional radiology  Procedures: None  Antimicrobials: None  Code Status: DNR   Subjective: Minimally awake, but does not interact  Objective: Noted elevated blood pressures Vitals:   12/23/22 0254 12/23/22 0401  BP: (!) 188/77 (!) 199/79  Pulse: 83 88  Resp: 20 18  Temp: 97.8 F (36.6 C)   SpO2: 97% 96%    Intake/Output  Summary (Last 24 hours) at 12/23/2022 1147 Last data filed at 12/22/2022 1927 Gross per 24 hour  Intake 1000 ml  Output 1100 ml  Net -100 ml   Filed Weights   12/22/22 1447 12/23/22 0401  Weight: 63.5 kg 54.8 kg   Body mass index is 18.37 kg/m.  Exam:  General: Awake, but not alert or oriented (did better with each therapy HEENT: Normocephalic, in c-collar, mucous membranes are dry Cardiovascular: Regular rate and rhythm, S1-S2 Respiratory: Clear to auscultation bilaterally although inspiratory effort limited Abdomen: Soft, nondistended, hypoactive bowel sounds Musculoskeletal: No clubbing or cyanosis, trace pitting edema Psychiatry: Obtunded Neurology: Ongoing Parkinson's, contracted  Data Reviewed: Creatinine down to 1.07.  Stable hemoglobin.  White blood cell count down to 10.8.  Disposition:  Status is: Inpatient    Anticipated discharge date: Unknown  Remaining issues to be resolved so that patient can be discharged:  -Improvement in mentation or change in plan of care -Palliative care to see -Restarting of Sinemet   Family Communication: Daughter at bedside DVT Prophylaxis: heparin injection 5,000 Units Start: 12/23/22 0600    Author: Hollice Espy ,MD 12/23/2022 11:47 AM  To reach On-call, see care teams to locate the attending and reach out via [email or web address]. Between 7PM-7AM, please contact night-coverage If you still have difficulty reaching the attending provider, please page the [facility] (Director on Call) for [facility] on amion for assistance.

## 2022-12-23 NOTE — Evaluation (Signed)
Occupational Therapy Evaluation Patient Details Name: Jose Gilbert MRN: [id number] DOB: 23-Mar-1928 Today's Date: 12/23/2022   History of Present Illness Jose Gilbert  is a 89 y.o. male,  with medical history significant for CAD, Parkinson disease, hypertension, and depression, dementia, close presenting from the setting home for evaluation of altered mental status, patient had fall at SNF on 5/5, sent to ED for evaluation, during that visit he was diagnosed with type II odontoid fracture with mild displacement/distraction and fracture, recommendation has been made for outpatient neurology workup and c-collar, patient was discharged back to SNF, with instruction to follow-up with Dr. Maisie Fus neurosurgery in 2 weeks, patient was brought back to the ED by SNF, as he remains altered, unable to eat or drink, there is no documentation from facility for any fever.  Daughter reports patient with significant dementia at baseline, but he was ambulatory prior to his fall.  -In ED imaging has been repeated including CT head and cervical spine, showing stable odontoid fracture, no acute finding and CT head, labs significant for baseline creatinine 1.37, white blood cell count at 11, UA is negative, checks x-ray with no acute findings, given his significant encephalopathy, [facility] hospitalist consulted to admit. (per MD)   Clinical Impression   Pt agreeable to OT and PT co-evaluation. Unsure of pt's baseline due to pt being a poor historian with minimal verbal communication today. Pt was lethargic and needing verbal and tactile cuing to follow commands. Difficult to fully assess B UE due to cognitive deficits. Pt is weak overall with noted P/ROM limitations in R UE. Mod A for step pivot to chair with max A at this time for most ADL's based on weakness and difficulty following commands. Pt left in the chair with chair alarm set, call bell within reach, and tray in front. Pt will benefit from continued OT in the hospital and  recommended venue below to increase strength, balance, and endurance for safe ADL's.         Recommendations for follow up therapy are one component of a multi-disciplinary discharge planning process, led by the attending physician.  Recommendations may be updated based on patient status, additional functional criteria and insurance authorization.   Assistance Recommended at Discharge Frequent or constant Supervision/Assistance  Patient can return home with the following A lot of help with walking and/or transfers;A lot of help with bathing/dressing/bathroom;Assistance with cooking/housework;Assistance with feeding;Assist for transportation;Help with stairs or ramp for entrance;Direct supervision/assist for medications management    Functional Status Assessment  Patient has had a recent decline in their functional status and demonstrates the ability to make significant improvements in function in a reasonable and predictable amount of time.  Equipment Recommendations  None recommended by OT           Precautions / Restrictions Precautions Precautions: Fall Precaution Comments: c-collar Required Braces or Orthoses: Cervical Brace Cervical Brace: Hard collar Restrictions Weight Bearing Restrictions: No      Mobility Bed Mobility Overal bed mobility: Needs Assistance Bed Mobility: Rolling, Sidelying to Sit Rolling: Mod assist, Max assist Sidelying to sit: Mod assist, Max assist       General bed mobility comments: Labored effort. Much assist.    Transfers Overall transfer level: Needs assistance Equipment used: Rolling walker (2 wheels) Transfers: Sit to/from Stand, Bed to chair/wheelchair/BSC Sit to Stand: Mod assist     Step pivot transfers: Mod assist     General transfer comment: Assist to boost from EOB and manage RW for steps to chair.  Unsteady in standing.      Balance Overall balance assessment: Needs assistance Sitting-balance support: No upper extremity  supported, Feet supported Sitting balance-Leahy Scale: Poor Sitting balance - Comments: seated at EOB Postural control: Posterior lean Standing balance support: Bilateral upper extremity supported, During functional activity, Reliant on assistive device for balance Standing balance-Leahy Scale: Poor Standing balance comment: using RW                           ADL either performed or assessed with clinical judgement   ADL Overall ADL's : Needs assistance/impaired Eating/Feeding: Maximal assistance   Grooming: Maximal assistance   Upper Body Bathing: Maximal assistance;Sitting   Lower Body Bathing: Maximal assistance;Sitting/lateral leans   Upper Body Dressing : Maximal assistance;Sitting   Lower Body Dressing: Maximal assistance;Sitting/lateral leans   Toilet Transfer: Moderate assistance;Rolling walker (2 wheels);Stand-pivot Statistician Details (indicate cue type and reason): Simulated via EOB to chair transfer. Toileting- Clothing Manipulation and Hygiene: Maximal assistance;Bed level       Functional mobility during ADLs: Moderate assistance;Rollator (4 wheels)       Vision Patient Visual Report:  (Unsure of pt's visiual history.) Additional Comments: Eyes closed for much of session. Difficult to assess at this time.                Pertinent Vitals/Pain Pain Assessment Pain Assessment: Faces Faces Pain Scale: Hurts little more Pain Location: P/ROM of R shoulder Pain Descriptors / Indicators: Grimacing, Guarding Pain Intervention(s): Limited activity within patient's tolerance, Monitored during session, Repositioned     Hand Dominance     Extremity/Trunk Assessment Upper Extremity Assessment Upper Extremity Assessment: RUE deficits/detail;LUE deficits/detail RUE Deficits / Details: Noted to reach to head level at one point during evaluation. Difficult to fully assess due to cognition. Noted to grimace when at ~75% avaialble range for shoulder  flexion P/ROM. LUE Deficits / Details: P/ROM near full for flexion and abduction. Diffiuclty to assess strength and active range of motion due to cognitive deficits.   Lower Extremity Assessment Lower Extremity Assessment: Defer to PT evaluation   Cervical / Trunk Assessment Cervical / Trunk Assessment: Kyphotic   Communication Communication Communication: HOH;Expressive difficulties   Cognition Arousal/Alertness: Lethargic Behavior During Therapy: Flat affect Overall Cognitive Status: History of cognitive impairments - at baseline                                 General Comments: Pt communicating minimally.                      Home Living Family/patient expects to be discharged to:: Skilled nursing facility                                 Additional Comments: Per notes, pt is from [facility].      Prior Functioning/Environment Prior Level of Function : Needs assist             Mobility Comments: using a RW, but unsure of accuracy (Per chart review) ADLs Comments: occasional assist for ADLs. (Per chart review.)        OT Problem List: Decreased strength;Decreased range of motion;Decreased activity tolerance;Impaired balance (sitting and/or standing);Decreased cognition;Decreased safety awareness;Impaired UE functional use      OT Treatment/Interventions: Self-care/ADL training;Therapeutic exercise;Therapeutic activities;Patient/family education;Balance training;DME and/or AE instruction;Cognitive  remediation/compensation;Visual/perceptual remediation/compensation    OT Goals(Current goals can be found in the care plan section) Acute Rehab OT Goals Patient Stated Goal: none stated OT Goal Formulation: Patient unable to participate in goal setting Time For Goal Achievement: 01/06/23 Potential to Achieve Goals:  (No goal stated)  OT Frequency: Min 2X/week    Co-evaluation PT/OT/SLP Co-Evaluation/Treatment: Yes Reason for  Co-Treatment: To address functional/ADL transfers   OT goals addressed during session: ADL's and self-care      AM-PAC OT "6 Clicks" Daily Activity     Outcome Measure Help from another person eating meals?: A Lot Help from another person taking care of personal grooming?: A Lot Help from another person toileting, which includes using toliet, bedpan, or urinal?: A Lot Help from another person bathing (including washing, rinsing, drying)?: A Lot Help from another person to put on and taking off regular upper body clothing?: A Lot Help from another person to put on and taking off regular lower body clothing?: A Lot 6 Click Score: 12   End of Session Equipment Utilized During Treatment: Rolling walker (2 wheels)  Activity Tolerance: Patient tolerated treatment well Patient left: in chair;with call bell/phone within reach;with chair alarm set  OT Visit Diagnosis: Unsteadiness on feet (R26.81);Other abnormalities of gait and mobility (R26.89);Muscle weakness (generalized) (M62.81);Other symptoms and signs involving cognitive function                Time: 1610-9604 OT Time Calculation (min): 17 min Charges:  OT General Charges $OT Visit: 1 Visit OT Evaluation $OT Eval Low Complexity: 1 Low  Cam Dauphin OT, MOT  Danie Chandler 12/23/2022, 9:35 AM

## 2022-12-23 NOTE — Progress Notes (Signed)
Pt arrived to unit from ED disoriented x4. Alert to verbal stimuli. C collar remains in place. Pt has order for nasal feeding tube, placement was attempted unsuccessfully x3 in ED. MD made aware and radiology to attempt in the morning. Bed in lowest position. Call bell in reach.

## 2022-12-23 NOTE — Consult Note (Addendum)
Consultation Note Date: 12/23/2022   Patient Name: Jose Gilbert  DOB: [birth year]  MRN: [id number]  Age / Sex: 89 y.o., male  PCP: Lindaann Pascal Referring Physician: Hollice Espy, MD  Reason for Consultation: Establishing goals of care  HPI/Patient Profile: 89 y.o. male  with past medical history of Parkinson's disease, dementia, CAD, HTN/HLD, insomnia/depression, PVD, CKD, constipation, resident of [facility] and memory care since August 2023, fall at [facility] on 5/5 with odontoid fracture with mild displacement and fracture with c-collar and outpatient neurology workup/follow-up with neurosurgeon in 2 weeks, admitted on 12/22/2022 with altered mental status, acute encephalopathy cause unknown but possibly related to significant frailty, malnutrition, Parkinson's, advanced age, dementia, recent fall with neck fracture requiring cervical collar placement.   Clinical Assessment and Goals of Care: I have reviewed medical records including EPIC notes, labs and imaging, received report from RN, assessed the patient.  [name] is lying quietly in bed.  He appears acutely/chronically ill and very frail.  He does not respond to my questions in any meaningful way.  He clearly cannot make his needs known.  His daughter, Rickey Barbara, is present at bedside.  We meet at the bedside to discuss diagnosis prognosis, GOC, EOL wishes, disposition and options.  I introduced Palliative Medicine as specialized medical care for people living with serious illness. It focuses on providing relief from the symptoms and stress of a serious illness. The goal is to improve quality of life for both the patient and the family.  We discussed a brief life review of the patient.  Rinaldo Cloud shares that [name] has been a resident of [facility] in their memory care unit since August 2023.  She shares that he is a widower x 2.  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  She tells me that his wife died around 46 and he remarried in 2001.  He lived with his new wife for 20 years, but when she died her daughter requested Rinaldo Cloud take over care for [name], moving him back to [place].  We then focused on their current illness.  I share that [name] has been through a lot lately.  Rinaldo Cloud agrees.  We talk about his fall (which are recurrent), the difficulty in wearing a c-collar, the plan for follow-up with neurosurgery outpatient.  We talk about how difficult it is for people with memory loss have change in routine for illness and injury.  We talk about the natural changes that occur with the progressive disorder of memory loss.  We talk about mental status changes.  Rinaldo Cloud shares that her father recognizes her 9 times out of 10, but only recognizes where he is in the skilled nursing facility about 75% of the time.  We also talk about nutritional changes that are expected with progressive dementia.  Rinaldo Cloud shares that  her father is on pured diet with thickened liquids.  I share that is difficult to maintain adequate hydration and nutrition with the specialized diets.  She does endorse weight loss.  We also talk about changes in mobility that are expected.  I share that it would not be surprising for [name] to have decreased mobility after this illness/injury.  We talk about poor by mouth intake and trial of temporary feeding tube.  Rinaldo Cloud states that her father would not want permanent tube feeding but she would like temporary tube feeding if possible.  We talk about a limited time trial, looking for "meaningful improvements".    We talk about how to make choices for loved ones including 1) keeping them at that center of decision-making 2) are we doing something for him or to him, can  we change what is happening open (we cannot change his memory loss or his frequent falls) and 3, the person he was 10 years ago, how would that person tell Rinaldo Cloud to care for this man nail.  Rinaldo Cloud states that her father is "a survivor".  Sharing that she knows that he would want everything possible to keep him alive.  I restate, "longevity is important to him" and she states that it he is.  I shared diagram of the chronic illness pathway.  We talk about what is normal and expected.  We talk about the concept of "let nature take its course".   The natural disease trajectory and expectations at EOL were discussed.  Advanced directives, concepts specific to code status, artifical feeding and hydration, and rehospitalization were considered and discussed.  DNR verified.  Family states that her father completed advanced directives in 2006 naming her as healthcare power of attorney.  She tells me that she knows that he would not want long-term nutritional support, no permanent tube feeding  Discussed the importance of continued conversation with family and the medical providers regarding overall plan of care and treatment options, ensuring decisions are within the context of the patient's values and GOCs.  Questions and concerns were addressed.  Hard Choices booklet left for review. The family was encouraged to call with questions or concerns.  PMT will continue to support holistically.  Conference with attending, bedside nursing staff, transition of care team related to patient condition, needs, goals of care.   HCPOA  HCPOA - Rinaldo Cloud shares that [name] completed advanced directives naming her as healthcare power of attorney in 2006.    SUMMARY OF RECOMMENDATIONS   At this point continue to treat the treatable but no CPR or intubation. Time for outcomes. Would accept temporary nasal feeding tube if possible with time trial limits. If improves, anticipate return to [facility] under long-term  care   Code Status/Advance Care Planning: DNR  Symptom Management:  Per hospitalist, no additional needs at this time.  Palliative Prophylaxis:  Frequent Pain Assessment, Oral Care, Palliative Wound Care, and Turn Reposition  Additional Recommendations (Limitations, Scope, Preferences): Continue to treat but no long-term PEG tube feeding  Psycho-social/Spiritual:  Desire for further Chaplaincy support:no Additional Recommendations: Caregiving  Support/Resources and Education on Hospice  Prognosis:  Unable to determine, guarded.  Would become more clear over the next few days.  Discharge Planning: To Be Determined      Primary Diagnoses: Present on Admission:  AMS (altered mental status)  Acute encephalopathy  Dementia due to Parkinson's disease without behavioral disturbance (HCC)  CAD (coronary artery disease)  HLD (hyperlipidemia)  Hypertension  Parkinson's disease  Protein-calorie malnutrition, severe   I have reviewed the medical record, interviewed the patient and family, and examined the patient. The following aspects are pertinent.  Past Medical History:  Diagnosis Date   Atherosclerotic heart disease of native coronary artery without angina pectoris    CAD (coronary artery disease) 12/31/2021   CKD (chronic kidney disease)    Constipation    Dementia (HCC)    GERD (gastroesophageal reflux disease)    History of falling    Hyperlipemia    Hypertension    Insomnia    MDD (major depressive disorder)    Parkinson's disease    PVD (peripheral vascular disease) (HCC)    Social History   Socioeconomic History   Marital status: Widowed    Spouse name: Not on file   Number of children: Not on file   Years of education: Not on file   Highest education level: Not on file  Occupational History   Not on file  Tobacco Use   Smoking status: Unknown   Smokeless tobacco: Not on file  Vaping Use   Vaping Use: Unknown  Substance and Sexual Activity    Alcohol use: Not Currently   Drug use: Not Currently   Sexual activity: Not Currently  Other Topics Concern   Not on file  Social History Narrative   Not on file   Social Determinants of Health   Financial Resource Strain: Not on file  Food Insecurity: Not on file  Transportation Needs: Not on file  Physical Activity: Not on file  Stress: Not on file  Social Connections: Not on file   History reviewed. No pertinent family history. Scheduled Meds:  carbidopa-levodopa  2 tablet Per Tube Daily   heparin  5,000 Units Subcutaneous Q8H   Continuous Infusions:  lactated ringers 100 mL/hr at 12/23/22 0544   PRN Meds:.hydrALAZINE Medications Prior to Admission:  Prior to Admission medications   Medication Sig Start Date End Date Taking? Authorizing Provider  acetaminophen (TYLENOL) 325 MG tablet Take 650 mg by mouth every 8 (eight) hours as needed for mild pain.   Yes [provider]  BELSOMRA 5 MG TABS Take 5 mg by mouth at bedtime. 11/25/21  Yes [provider]  carbidopa-levodopa (SINEMET IR) 25-100 MG tablet Take 2 tablets by mouth in the morning. 12/19/21  Yes [provider]  nitroGLYCERIN (NITROSTAT) 0.4 MG SL tablet Place 0.4 mg under the tongue See admin instructions. 10/16/21  Yes [provider]  polyethylene glycol (MIRALAX / GLYCOLAX) 17 g packet Take 17 g by mouth every 6 (six) hours as needed for mild constipation.   Yes [provider]  senna-docusate (SENOKOT-S) 8.6-50 MG tablet Take 1 tablet by mouth at bedtime.   Yes [provider]  simvastatin (ZOCOR) 80 MG tablet Take 40 mg by mouth at bedtime. 12/18/21  Yes [provider]  venlafaxine XR (EFFEXOR-XR) 75 MG 24 hr capsule Take 225 mg by mouth at bedtime. 10/23/21  Yes [provider]   Allergies  Allergen Reactions   Benadryl [Diphenhydramine] Other (See Comments)    UNK reaction ( not on MAR)   Imdur [Isosorbide Nitrate] Other (See Comments)     UNK reaction   Iodinated Contrast Media Other (See Comments)    UNK reaction   Mirtazapine Other (See Comments)    UNK reaction   Penicillins Other (See Comments)    UNK reaction   Shellfish Allergy Other (See Comments)    Unk reaction   Tramadol Other (See  Comments)    UNK reaction   Trazodone Rash   Review of Systems  Unable to perform ROS: Dementia    Physical Exam Vitals and nursing note reviewed.  Constitutional:      General: He is not in acute distress.    Appearance: He is ill-appearing.     Comments: Appears quite weak and frail  HENT:     Head:     Comments: Forehead stitches    Mouth/Throat:     Mouth: Mucous membranes are dry.  Cardiovascular:     Rate and Rhythm: Normal rate.  Pulmonary:     Effort: Pulmonary effort is normal. No respiratory distress.  Musculoskeletal:     Comments: Cervical collar in place  Skin:    General: Skin is warm and dry.     Comments: Bruising, forehead stitches  Neurological:     Comments: Known dementia, but does not respond in any meaningful way to my questions     Vital Signs: BP (!) 199/79 (BP Location: Left Arm)   Pulse 88   Temp 97.8 F (36.6 C) (Oral)   Resp 18   Ht 5\' 8"  (1.727 m)   Wt 54.8 kg   SpO2 96%   BMI 18.37 kg/m  Pain Scale: PAINAD   Pain Score: 0-No pain   SpO2: SpO2: 96 % O2 Device:SpO2: 96 % O2 Flow Rate: .   IO: Intake/output summary:  Intake/Output Summary (Last 24 hours) at 12/23/2022 1007 Last data filed at 12/22/2022 1927 Gross per 24 hour  Intake 1000 ml  Output 1100 ml  Net -100 ml    LBM:   Baseline Weight: Weight: 63.5 kg Most recent weight: Weight: 54.8 kg     Palliative Assessment/Data:     Time In: 1030 Time Out: 1145 Time Total: 75 minutes  Greater than 50%  of this time was spent counseling and coordinating care related to the above assessment and plan.  Signed by: Katheran Awe, NP   Please contact Palliative Medicine Team phone at [phone] for questions and  concerns.  For individual provider: See Loretha Stapler

## 2022-12-23 NOTE — TOC Initial Note (Signed)
Transition of Care [facility]) - Initial/Assessment Note    Patient Details  Name: Jose Gilbert MRN: [id number] Date of Birth: 24-May-1928  Transition [facility]) CM/SW Contact:    Jose Gault, LCSW Phone Number: 12/23/2022, 11:44 AM  Clinical Narrative:                  Pt admitted from [facility]. Spoke with pt's daughter/POA, Jose Gilbert, to assess.   Per Jose Gilbert, pt will return to [facility] at [employer]. She anticipated EMS transport at dc.  Jose Gilbert aware that TOC will follow and assist with dc planning needs.  Expected Discharge Plan: Long Term Nursing Home Barriers to Discharge: Continued Medical Work up   Patient Goals and CMS Choice Patient states their goals for this hospitalization and ongoing recovery are:: return to facility CMS Medicare.gov Compare Post Acute Care list provided to:: Patient Represenative (must comment) Choice offered to / list presented to : Adult Children      Expected Discharge Plan and Services In-house Referral: Clinical Social Work   Post Acute Care Choice: Resumption of Svcs/PTA Provider Living arrangements for the past 2 months: Skilled Nursing Facility                                      Prior Living Arrangements/Services Living arrangements for the past 2 months: Skilled Nursing Facility Lives with:: Facility Resident Patient language and need for interpreter reviewed:: Yes Do you feel safe going back to the place where you live?: Yes      Need for Family Participation in Patient Care: No (Comment) Care giver support system in place?: Yes (comment)   Criminal Activity/Legal Involvement Pertinent to Current Situation/Hospitalization: No - Comment as needed  Activities of Daily Living   ADL Screening (condition at time of admission) Patient's cognitive ability adequate to safely complete daily activities?: No Does the patient have difficulty concentrating, remembering, or making decisions?: Yes Patient able to express  need for assistance with ADLs?: No Does the patient have difficulty dressing or bathing?: Yes Independently performs ADLs?: No Dressing (OT): Dependent Grooming: Dependent Feeding: Dependent Bathing: Dependent Toileting: Dependent  Permission Sought/Granted                  Emotional Assessment       Orientation: : Oriented to Self Alcohol / Substance Use: Not Applicable Psych Involvement: No (comment)  Admission diagnosis:  Dehydration [E86.0] Closed nondisplaced fracture of first cervical vertebra, unspecified fracture morphology, initial encounter (HCC) [S12.001A] Encephalomyopathy [G93.9] Closed odontoid fracture, initial encounter (HCC) [S12.100A] AMS (altered mental status) [R41.82] Patient Active Problem List   Diagnosis Date Noted   AMS (altered mental status) 12/22/2022   Hearing loss 12/19/2022   HLD (hyperlipidemia) 12/19/2022   Acute renal failure superimposed on stage 3a chronic kidney disease (HCC) 12/31/2021   CAD (coronary artery disease) 12/31/2021   AKI (acute kidney injury) (HCC) 12/31/2021   Protein-calorie malnutrition, severe 12/31/2021   Parkinson's disease    Acute encephalopathy    Hypertension    Dementia due to Parkinson's disease without behavioral disturbance (HCC) 05/04/2019   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 [place] [address] Phone: [phone] Fax: [phone]     Social Determinants of Health (SDOH) Social History:   SDOH Interventions:  Readmission Risk Interventions     No data to display

## 2022-12-23 NOTE — Evaluation (Signed)
Clinical/Bedside Swallow Evaluation Patient Details  Name: Jose Gilbert MRN: [id number] Date of Birth: [birth year]  Today's Date: 12/23/2022 Time: SLP Start Time (ACUTE ONLY): 1420 SLP Stop Time (ACUTE ONLY): 1444 SLP Time Calculation (min) (ACUTE ONLY): 24 min  Past Medical History:  Past Medical History:  Diagnosis Date   Atherosclerotic heart disease of native coronary artery without angina pectoris    CAD (coronary artery disease) 12/31/2021   CKD (chronic kidney disease)    Constipation    Dementia (HCC)    GERD (gastroesophageal reflux disease)    History of falling    Hyperlipemia    Hypertension    Insomnia    MDD (major depressive disorder)    Parkinson's disease    PVD (peripheral vascular disease) (HCC)    Past Surgical History: History reviewed. No pertinent surgical history. HPI:  Jose Gilbert  is a 89 y.o. male,  with medical history significant for CAD, Parkinson disease, hypertension, and depression, dementia, close presenting from the setting home for evaluation of altered mental status, patient had fall at SNF on 5/5, sent to ED for evaluation, during that visit he was diagnosed with type II odontoid fracture with mild displacement/distraction and fracture, recommendation has been made for outpatient neurology workup and c-collar, patient was discharged back to SNF, with instruction to follow-up with Dr. Maisie Fus neurosurgery in 2 weeks, patient was brought back to the ED by SNF, as he remains altered, unable to eat or drink, there is no documentation from facility for any fever.  Daughter reports patient with significant dementia at baseline, but he was ambulatory prior to his fall.  -In ED imaging has been repeated including CT head and cervical spine, showing stable odontoid fracture, no acute finding and CT head, labs significant for baseline creatinine 1.37, white blood cell count at 11, UA is negative, checks x-ray with no acute findings, given his significant  encephalopathy, [facility] hospitalist consulted to admit. (per MD). Significantly obtundent, unsafe to swallow, so NGT has been attempted in ED, x 3, give his Parkinson medications, was unsuccessful, consult has been put to radiology to attempt in a.m.. BSE requested.    Assessment / Plan / Recommendation  Clinical Impression  Clinical swallow evaluation completed at bedside with Pt's daughter present. She reports that Pt previously stayed at [facility] in [place], but was moved to [place] in August to the memory care unit. He was able to feed himself prior to his fall a few days ago (puree and she thinks NTL), but has not been alert and not eating since his fall. NG was attempted, but unable to place. Pt responded to ice chips to lips and immediately opened his mouth to accept. He exhibited prolonged oral holding with all boluses presented (ice chips, puree, ice cream, and NTL water), but did eventually swallow with verbal encouragement from SLP and daughter. D/W daughter, RN, and MD, will initiate D1/puree and NTL via teaspoon with focus on giving Pt his medication (sinemet for PD) and not to feed Pt if he is not alert enough due to aspiration risk. All are in agreement with plan of care. SLP will follow in acute stay. Encourage oral care and PO only when Pt is alert and accepting. SLP Visit Diagnosis: Dysphagia, unspecified (R13.10)    Aspiration Risk  Mild aspiration risk;Risk for inadequate nutrition/hydration;Moderate aspiration risk    Diet Recommendation Dysphagia 1 (Puree);Nectar-thick liquid   Liquid Administration via: Spoon Medication Administration: Whole meds with puree Supervision: Staff to assist with  self feeding;Full supervision/cueing for compensatory strategies Compensations: Slow rate;Small sips/bites;Multiple dry swallows after each bite/sip Postural Changes: Seated upright at 90 degrees;Remain upright for at least 30 minutes after po intake    Other  Recommendations  Oral Care Recommendations: Oral care QID;Staff/trained caregiver to provide oral care Caregiver Recommendations: Avoid jello, ice cream, thin soups, popsicles;Have oral suction available    Recommendations for follow up therapy are one component of a multi-disciplinary discharge planning process, led by the attending physician.  Recommendations may be updated based on patient status, additional functional criteria and insurance authorization.  Follow up Recommendations Skilled nursing-short term rehab (<3 hours/day)      Assistance Recommended at Discharge    Functional Status Assessment Patient has had a recent decline in their functional status and demonstrates the ability to make significant improvements in function in a reasonable and predictable amount of time.  Frequency and Duration min 2x/week  1 week       Prognosis Prognosis for improved oropharyngeal function: Fair Barriers to Reach Goals: Behavior      Swallow Study   General Date of Onset: 12/22/22 HPI: Jose Gilbert  is a 89 y.o. male,  with medical history significant for CAD, Parkinson disease, hypertension, and depression, dementia, close presenting from the setting home for evaluation of altered mental status, patient had fall at SNF on 5/5, sent to ED for evaluation, during that visit he was diagnosed with type II odontoid fracture with mild displacement/distraction and fracture, recommendation has been made for outpatient neurology workup and c-collar, patient was discharged back to SNF, with instruction to follow-up with Dr. Maisie Fus neurosurgery in 2 weeks, patient was brought back to the ED by SNF, as he remains altered, unable to eat or drink, there is no documentation from facility for any fever.  Daughter reports patient with significant dementia at baseline, but he was ambulatory prior to his fall.  -In ED imaging has been repeated including CT head and cervical spine, showing stable odontoid fracture, no acute finding  and CT head, labs significant for baseline creatinine 1.37, white blood cell count at 11, UA is negative, checks x-ray with no acute findings, given his significant encephalopathy, [facility] hospitalist consulted to admit. (per MD). Significantly obtundent, unsafe to swallow, so NGT has been attempted in ED, x 3, give his Parkinson medications, was unsuccessful, consult has been put to radiology to attempt in a.m.. BSE requested. Type of Study: Bedside Swallow Evaluation Previous Swallow Assessment: 12/31/2021 Diet Prior to this Study: NPO (puree/NTL at [facility] per daughter) Temperature Spikes Noted: No Respiratory Status: Room air History of Recent Intubation: No Behavior/Cognition: Lethargic/Drowsy;Requires cueing Oral Cavity Assessment: Dry Oral Care Completed by SLP: Yes Oral Cavity - Dentition: Poor condition Vision: Impaired for self-feeding Self-Feeding Abilities: Total assist Patient Positioning: Upright in bed Baseline Vocal Quality: Not observed Volitional Cough: Cognitively unable to elicit Volitional Swallow: Unable to elicit    Oral/Motor/Sensory Function Overall Oral Motor/Sensory Function: Generalized oral weakness   Ice Chips Ice chips: Impaired Presentation: Spoon Oral Phase Impairments: Reduced lingual movement/coordination Oral Phase Functional Implications: Prolonged oral transit Pharyngeal Phase Impairments: Suspected delayed Swallow   Thin Liquid Thin Liquid: Not tested    Nectar Thick Nectar Thick Liquid: Impaired Presentation: Spoon Oral Phase Impairments: Reduced lingual movement/coordination Oral phase functional implications: Prolonged oral transit;Oral holding   Honey Thick Honey Thick Liquid: Not tested   Puree Puree: Impaired Presentation: Spoon Oral Phase Impairments: Reduced lingual movement/coordination Oral Phase Functional Implications: Prolonged oral transit  Solid     Solid: Not tested     Thank you,  Havery Moros,  CCC-SLP [phone]  Jose Gilbert 12/23/2022,3:08 PM

## 2022-12-23 NOTE — Plan of Care (Signed)
  Problem: Acute Rehab OT Goals (only OT should resolve) Goal: Pt. Will Perform Grooming Flowsheets (Taken 12/23/2022 0937) Pt Will Perform Grooming:  with min guard assist  sitting Goal: Pt. Will Perform Upper Body Bathing Flowsheets (Taken 12/23/2022 0937) Pt Will Perform Upper Body Bathing:  with min guard assist  with min assist  sitting Goal: Pt. Will Perform Upper Body Dressing Flowsheets (Taken 12/23/2022 0937) Pt Will Perform Upper Body Dressing:  with min assist  with min guard assist  sitting Goal: Pt. Will Perform Lower Body Dressing Flowsheets (Taken 12/23/2022 0937) Pt Will Perform Lower Body Dressing:  with mod assist  with min assist  sitting/lateral leans Goal: Pt. Will Transfer To Toilet Flowsheets (Taken 12/23/2022 [phone]) Pt Will Transfer to Toilet:  with min guard assist  stand pivot transfer Goal: Pt. Will Perform Toileting-Clothing Manipulation Flowsheets (Taken 12/23/2022 0937) Pt Will Perform Toileting - Clothing Manipulation and hygiene:  with mod assist  sitting/lateral leans Goal: Pt/Caregiver Will Perform Home Exercise Program Flowsheets (Taken 12/23/2022 [phone]) Pt/caregiver will Perform Home Exercise Program:  Increased strength  Increased ROM  Both right and left upper extremity  With minimal assist  Dorthy Magnussen OT, MOT

## 2022-12-23 NOTE — NC FL2 (Signed)
  [place] MEDICAID FL2 LEVEL OF CARE FORM     IDENTIFICATION  Patient Name: PANTH HIEB Birthdate: Aug 12, 1928 Sex: male Admission Date (Current Location): 12/22/2022  [facility] and [place] Number:  [employer] and Address:  [facility],  [address], [place] [address]      Provider Number: [id number]  Attending Physician Name and Address:  Hollice Espy, MD  Relative Name and Phone Number:       Current Level of Care: Hospital Recommended Level of Care: Skilled Nursing Facility Prior Approval Number:    Date Approved/Denied:   PASRR Number:    Discharge Plan: SNF    Current Diagnoses: Patient Active Problem List   Diagnosis Date Noted   AMS (altered mental status) 12/22/2022   Hearing loss 12/19/2022   HLD (hyperlipidemia) 12/19/2022   Acute renal failure superimposed on stage 3a chronic kidney disease (HCC) 12/31/2021   CAD (coronary artery disease) 12/31/2021   AKI (acute kidney injury) (HCC) 12/31/2021   Protein-calorie malnutrition, severe 12/31/2021   Parkinson's disease    Acute encephalopathy    Hypertension    Dementia due to Parkinson's disease without behavioral disturbance (HCC) 05/04/2019   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    Orientation RESPIRATION BLADDER Height & Weight     Self  Normal Incontinent Weight: 120 lb 13 oz (54.8 kg) Height:  5\' 8"  (172.7 cm)  BEHAVIORAL SYMPTOMS/MOOD NEUROLOGICAL BOWEL NUTRITION STATUS      Incontinent Diet (see dc summary)  AMBULATORY STATUS COMMUNICATION OF NEEDS Skin   Extensive Assist Verbally Normal                       Personal Care Assistance Level of Assistance  Bathing, Feeding, Dressing Bathing Assistance: Maximum assistance Feeding assistance: Limited assistance Dressing Assistance: Maximum assistance     Functional Limitations Info  Sight, Hearing, Speech Sight Info: Adequate   Speech Info: Adequate    SPECIAL CARE FACTORS FREQUENCY                        Contractures Contractures Info: Not present    Additional Factors Info  Code Status, Allergies, Psychotropic Code Status Info: DNR Allergies Info: Benadryl (Diphenhydramine), Imdur (Isosorbide Nitrate), Iodinated Contrast Media, Mirtazapine, Penicillins, Shellfish Allergy, Tramadol, Trazodone Psychotropic Info: Effexor         Current Medications (12/23/2022):  This is the current hospital active medication list Current Facility-Administered Medications  Medication Dose Route Frequency Provider Last Rate Last Admin   carbidopa-levodopa (SINEMET IR) 25-100 MG per tablet immediate release 2 tablet  2 tablet Per Tube Daily Elgergawy, Leana Roe, MD       heparin injection 5,000 Units  5,000 Units Subcutaneous Q8H Elgergawy, Leana Roe, MD   5,000 Units at 12/23/22 0537   hydrALAZINE (APRESOLINE) injection 5 mg  5 mg Intravenous Q4H PRN Elgergawy, Leana Roe, MD       lactated ringers infusion   Intravenous Continuous Elgergawy, Leana Roe, MD 100 mL/hr at 12/23/22 0544 New Bag at 12/23/22 0544     Discharge Medications: Please see discharge summary for a list of discharge medications.  Relevant Imaging Results:  Relevant Lab Results:   Additional Information    Elliot Gault, LCSW

## 2022-12-23 NOTE — Plan of Care (Signed)
  Problem: Acute Rehab PT Goals(only PT should resolve) Goal: Pt Will Go Supine/Side To Sit Outcome: Progressing Flowsheets (Taken 12/23/2022 1442) Pt will go Supine/Side to Sit:  with minimal assist  with moderate assist Goal: Patient Will Transfer Sit To/From Stand Outcome: Progressing Flowsheets (Taken 12/23/2022 1442) Patient will transfer sit to/from stand:  with minimal assist  with moderate assist Goal: Pt Will Transfer Bed To Chair/Chair To Bed Outcome: Progressing Flowsheets (Taken 12/23/2022 1442) Pt will Transfer Bed to Chair/Chair to Bed: with mod assist Goal: Pt Will Ambulate Outcome: Progressing Flowsheets (Taken 12/23/2022 1442) Pt will Ambulate:  15 feet  with moderate assist  with rolling walker   2:43 PM, 12/23/22 Ocie Bob, MPT Physical Therapist with [facility] [phone] office [phone] mobile phone

## 2022-12-23 NOTE — Evaluation (Signed)
Physical Therapy Evaluation Patient Details Name: Jose Gilbert MRN: [id number] DOB: 22-Aug-1927 Today's Date: 12/23/2022  History of Present Illness  Jose Gilbert  is a 89 y.o. male,  with medical history significant for CAD, Parkinson disease, hypertension, and depression, dementia, close presenting from the setting home for evaluation of altered mental status, patient had fall at SNF on 5/5, sent to ED for evaluation, during that visit he was diagnosed with type II odontoid fracture with mild displacement/distraction and fracture, recommendation has been made for outpatient neurology workup and c-collar, patient was discharged back to SNF, with instruction to follow-up with Dr. Maisie Fus neurosurgery in 2 weeks, patient was brought back to the ED by SNF, as he remains altered, unable to eat or drink, there is no documentation from facility for any fever.  Daughter reports patient with significant dementia at baseline, but he was ambulatory prior to his fall.  -In ED imaging has been repeated including CT head and cervical spine, showing stable odontoid fracture, no acute finding and CT head, labs significant for baseline creatinine 1.37, white blood cell count at 11, UA is negative, checks x-ray with no acute findings, given his significant encephalopathy, [facility] hospitalist consulted to admit.   Clinical Impression  Patient presents lethargic and became more alert after sitting up at bedside with Mod/max assist.  Patient limited to taking a few slow labored side steps at bedside before having to sit due to legs buckling with near fall.  Patient tolerated sitting up in chair after therapy - nursing staff notified.  Patient will benefit from continued skilled physical therapy in hospital and recommended venue below to increase strength, balance, endurance for safe ADLs and gait.         Recommendations for follow up therapy are one component of a multi-disciplinary discharge planning process, led by the  attending physician.  Recommendations may be updated based on patient status, additional functional criteria and insurance authorization.  Follow Up Recommendations Can patient physically be transported by private vehicle: Yes     Assistance Recommended at Discharge Intermittent Supervision/Assistance  Patient can return home with the following  A lot of help with bathing/dressing/bathroom;A lot of help with walking and/or transfers;Help with stairs or ramp for entrance;Assistance with cooking/housework    Equipment Recommendations None recommended by PT  Recommendations for Other Services       Functional Status Assessment Patient has had a recent decline in their functional status and demonstrates the ability to make significant improvements in function in a reasonable and predictable amount of time.     Precautions / Restrictions Precautions Precautions: Fall Precaution Comments: c-collar Required Braces or Orthoses: Cervical Brace Cervical Brace: Hard collar Restrictions Weight Bearing Restrictions: No      Mobility  Bed Mobility Overal bed mobility: Needs Assistance Bed Mobility: Rolling, Sidelying to Sit Rolling: Mod assist, Max assist Sidelying to sit: Mod assist, Max assist       General bed mobility comments: requires Mod/max verbal/tactile cueing for following instructions    Transfers Overall transfer level: Needs assistance Equipment used: Rolling walker (2 wheels) Transfers: Sit to/from Stand, Bed to chair/wheelchair/BSC Sit to Stand: Mod assist   Step pivot transfers: Mod assist       General transfer comment: unsteady labored movement    Ambulation/Gait Ambulation/Gait assistance: Mod assist, Max assist Gait Distance (Feet): 4 Feet Assistive device: Rolling walker (2 wheels) Gait Pattern/deviations: Decreased step length - right, Decreased step length - left, Decreased stride length, Trunk flexed Gait velocity: slow  General Gait Details:  limited to a few unsteady labored side steps before having to sit due to fatigue/fall risk  Stairs            Wheelchair Mobility    Modified Rankin (Stroke Patients Only)       Balance Overall balance assessment: Needs assistance Sitting-balance support: Feet supported, No upper extremity supported Sitting balance-Leahy Scale: Poor Sitting balance - Comments: seated at EOB   Standing balance support: Bilateral upper extremity supported, During functional activity, Reliant on assistive device for balance Standing balance-Leahy Scale: Poor Standing balance comment: using RW                             Pertinent Vitals/Pain Pain Assessment Pain Assessment: Faces Faces Pain Scale: Hurts little more Pain Location: P/ROM of R shoulder Pain Descriptors / Indicators: Grimacing, Guarding Pain Intervention(s): Limited activity within patient's tolerance, Monitored during session, Repositioned    Home Living Family/patient expects to be discharged to:: Skilled nursing facility                   Additional Comments: Per notes, pt is from [facility].    Prior Function Prior Level of Function : Needs assist       Physical Assist : Mobility (physical);ADLs (physical) Mobility (physical): Bed mobility;Transfers;Gait;Stairs   Mobility Comments: using a RW, but unsure of accuracy ADLs Comments: occasional assist for ADLs.     Hand Dominance   Dominant Hand: Right    Extremity/Trunk Assessment   Upper Extremity Assessment Upper Extremity Assessment: Defer to OT evaluation    Lower Extremity Assessment Lower Extremity Assessment: Generalized weakness    Cervical / Trunk Assessment Cervical / Trunk Assessment: Kyphotic  Communication   Communication: HOH  Cognition Arousal/Alertness: Lethargic Behavior During Therapy: Flat affect Overall Cognitive Status: History of cognitive impairments - at baseline                                           General Comments      Exercises     Assessment/Plan    PT Assessment Patient needs continued PT services  PT Problem List Decreased strength;Decreased activity tolerance;Decreased balance;Decreased mobility       PT Treatment Interventions DME instruction;Gait training;Stair training;Functional mobility training;Therapeutic activities;Therapeutic exercise;Patient/family education;Balance training    PT Goals (Current goals can be found in the Care Plan section)  Acute Rehab PT Goals Patient Stated Goal: return home PT Goal Formulation: With patient Time For Goal Achievement: 01/06/23 Potential to Achieve Goals: Good    Frequency Min 3X/week     Co-evaluation PT/OT/SLP Co-Evaluation/Treatment: Yes Reason for Co-Treatment: To address functional/ADL transfers PT goals addressed during session: Mobility/safety with mobility;Balance;Proper use of DME         AM-PAC PT "6 Clicks" Mobility  Outcome Measure Help needed turning from your back to your side while in a flat bed without using bedrails?: A Lot Help needed moving from lying on your back to sitting on the side of a flat bed without using bedrails?: A Lot Help needed moving to and from a bed to a chair (including a wheelchair)?: A Lot Help needed standing up from a chair using your arms (e.g., wheelchair or bedside chair)?: A Lot Help needed to walk in hospital room?: A Lot Help needed climbing 3-5 steps with a railing? :  Total 6 Click Score: 11    End of Session   Activity Tolerance: Patient tolerated treatment well;Patient limited by fatigue Patient left: in chair;with call bell/phone within reach;with chair alarm set Nurse Communication: Mobility status PT Visit Diagnosis: Unsteadiness on feet (R26.81);Other abnormalities of gait and mobility (R26.89);Muscle weakness (generalized) (M62.81)    Time: 7829-5621 PT Time Calculation (min) (ACUTE ONLY): 20 min   Charges:   PT Evaluation $PT  Eval Moderate Complexity: 1 Mod PT Treatments $Therapeutic Activity: 8-22 mins        2:41 PM, 12/23/22 Ocie Bob, MPT Physical Therapist with [facility] [phone] office [phone] mobile phone

## 2022-12-24 DIAGNOSIS — G9341 Metabolic encephalopathy: Secondary | ICD-10-CM | POA: Diagnosis not present

## 2022-12-24 DIAGNOSIS — E43 Unspecified severe protein-calorie malnutrition: Secondary | ICD-10-CM | POA: Diagnosis not present

## 2022-12-24 DIAGNOSIS — G20A2 Parkinson's disease without dyskinesia, with fluctuations: Secondary | ICD-10-CM | POA: Diagnosis not present

## 2022-12-24 DIAGNOSIS — G20A1 Parkinson's disease without dyskinesia, without mention of fluctuations: Secondary | ICD-10-CM | POA: Diagnosis not present

## 2022-12-24 LAB — BRAIN NATRIURETIC PEPTIDE: B Natriuretic Peptide: 267 pg/mL — ABNORMAL HIGH (ref 0.0–100.0)

## 2022-12-24 MED ORDER — VENLAFAXINE HCL ER 75 MG PO CP24
225.0000 mg | ORAL_CAPSULE | Freq: Every day | ORAL | Status: DC
  Administered 2022-12-24 – 2022-12-26 (×3): 225 mg via ORAL
  Filled 2022-12-24 (×3): qty 3

## 2022-12-24 MED ORDER — ATORVASTATIN CALCIUM 40 MG PO TABS
40.0000 mg | ORAL_TABLET | Freq: Every day | ORAL | Status: DC
  Administered 2022-12-24 – 2022-12-27 (×4): 40 mg via ORAL
  Filled 2022-12-24 (×4): qty 1

## 2022-12-24 NOTE — Care Management Important Message (Signed)
Important Message  Patient Details  Name: Jose Gilbert MRN: [id number] Date of Birth: 11/04/1927   Medicare Important Message Given:  N/A - LOS <3 / Initial given by admissions     Corey Harold 12/24/2022, 10:36 AM

## 2022-12-24 NOTE — Progress Notes (Addendum)
[facility] Progress Note  Patient: Jose Gilbert    ZOX:[id number]  DOA: 12/22/2022    Date of Service: the patient was seen and examined on 12/24/2022  Brief hospital course: Patient is a 89 year old male with past medical history of Parkinson's disease, dementia, hypertension and depression who came from his skilled nursing facility to the ED on 5/5 after a fall and at that time, was diagnosed with a type II odontoid fracture with mild displacement/distraction fracture and patient was discharged with c-collar and outpatient neurology follow-up as well as following up with neurosurgery in 2 weeks.  Patient brought back to the emergency room on 5/8 with acute confusion, not eating or drinking and not ambulatory.  Initial lab work unremarkable, but patient remained obtunded.  However, patient is starting to show signs of improvement.  Seen by speech therapy and was able to remain alert enough that he could have medicines and ice cream or applesauce.  Have been able to start getting doses of Sinemet into a.m. and patient is more interactive.   Assessment and Plan: Senile dementia with behavioral disturbance and delirium/acute metabolic encephalopathy: Patient slightly more alert this afternoon and passed swallow evaluation.  Procalcitonin negative.   Parkinson's disease: I have restarted Sinemet which is actually improved patient's mentation.  CAD and hyperlipidemia: Restart statin.  CKD 3A: Renal function at baseline, continue gentle IV fluids.  Today, creatinine down to 1.07, much improved from even his baseline.  Recent fall with cervical fracture: Repeat films in ED note stable fracture from several days ago.  Acute diastolic heart failure: Noted elevated BNP of 267, checked because pressures had been elevated.  No previous history of heart failure no echocardiogram on chart.  Discussed with daughter we will go ahead and check echo just to assess given that he is continuing to get  fluids.   Elevated blood pressures: No history of hypertension.  As needed hydralazine.  Body mass index is 18.37 kg/m.        Consultants: Interventional radiology  Procedures: None  Antimicrobials: None  Code Status: DNR   Subjective: More interactive, with good voice.  Hard of hearing.  Objective: Noted elevated blood pressures Vitals:   12/24/22 0428 12/24/22 1326  BP: (!) 138/101 (!) 121/51  Pulse: 79 81  Resp: 20 14  Temp: 98.6 F (37 C) 98 F (36.7 C)  SpO2: 97% 94%    Intake/Output Summary (Last 24 hours) at 12/24/2022 1524 Last data filed at 12/24/2022 1136 Gross per 24 hour  Intake 30 ml  Output 800 ml  Net -770 ml    Filed Weights   12/22/22 1447 12/23/22 0401  Weight: 63.5 kg 54.8 kg   Body mass index is 18.37 kg/m.  Exam:  General: Oriented x 1-2, interactive HEENT: Normocephalic, in c-collar, mucous membranes are dry Cardiovascular: Regular rate and rhythm, S1-S2 Respiratory: Clear to auscultation bilaterally although inspiratory effort limited Abdomen: Soft, nondistended, hypoactive bowel sounds Musculoskeletal: No clubbing or cyanosis, trace pitting edema Psychiatry: Somewhat appropriate. Neurology: Ongoing Parkinson's, contracted  Data Reviewed: BNP of 267.  Disposition:  Status is: Inpatient    Anticipated discharge date: Unknown  Remaining issues to be resolved so that patient can be discharged:  -Improvement in mentation or change in plan of care -Palliative care to see   Family Communication: Daughter at bedside DVT Prophylaxis: heparin injection 5,000 Units Start: 12/23/22 0600    Author: Hollice Espy ,MD 12/24/2022 3:24 PM  To reach On-call, see care teams to locate  the attending and reach out via [email or web address]. Between 7PM-7AM, please contact night-coverage If you still have difficulty reaching the attending provider, please page the [facility] (Director on Call) for [facility] on amion for  assistance.

## 2022-12-24 NOTE — Progress Notes (Signed)
Speech Language Pathology Treatment: Dysphagia  Patient Details Name: Jose Gilbert MRN: [id number] DOB: April 08, 1928 Today's Date: 12/24/2022 Time: 7829-5621 SLP Time Calculation (min) (ACUTE ONLY): 47 min  Assessment / Plan / Recommendation Clinical Impression  Ongoing diagnostic dysphagia therapy provided; Nurse reports attempted medications earlier this morning but eventually manually removed them from oral cavity as Pt would not/could not swallow. Pt was more alert and responsive opening his mouth on command upon SLP entering the room; nurse re-attempted crushed meds with SLP with ice cream and with much cueing, verbal and tactile cues did swallow. Note with initial small bite Pt did not swallow, SLP encouraged providing an additional bite of ice cream which seemed to facilitate triggering a swallow. Pt's daughter arrived during session and lunch tray; Pt did consumed ~ 5-6 bites of puree meat and carrots plus tsp sips of Nectar thick tea; note this required ~30 minutes of time and constant encouragement. Significant oral holding and suspected delayed swallow upon palpation with puree. Recommend continue diet of D1/puree and NECTAR thick liquids note recommendations: crush meds and administer in ICE CREAM, larger bites of puree facilitate sensation and ultimately swallowing trigger, follow puree presentation with tsp sip of NTL if needed to facilitate swallow, VERBAL (loudly as pt is HOH) cues in Pt's ear to "swallow" were also seemingly effective. Please be vigilant with oral care (3-4x/day). Above to nurse, daughter and MD. ST will continue to follow acutely,    HPI HPI: Jose Gilbert  is a 89 y.o. male,  with medical history significant for CAD, Parkinson disease, hypertension, and depression, dementia, close presenting from the setting home for evaluation of altered mental status, patient had fall at SNF on 5/5, sent to ED for evaluation, during that visit he was diagnosed with type II odontoid  fracture with mild displacement/distraction and fracture, recommendation has been made for outpatient neurology workup and c-collar, patient was discharged back to SNF, with instruction to follow-up with Dr. Maisie Fus neurosurgery in 2 weeks, patient was brought back to the ED by SNF, as he remains altered, unable to eat or drink, there is no documentation from facility for any fever.  Daughter reports patient with significant dementia at baseline, but he was ambulatory prior to his fall.  -In ED imaging has been repeated including CT head and cervical spine, showing stable odontoid fracture, no acute finding and CT head, labs significant for baseline creatinine 1.37, white blood cell count at 11, UA is negative, checks x-ray with no acute findings, given his significant encephalopathy, [facility] hospitalist consulted to admit. (per MD). Significantly obtundent, unsafe to swallow, so NGT has been attempted in ED, x 3, give his Parkinson medications, was unsuccessful, consult has been put to radiology to attempt in a.m.. BSE requested.      SLP Plan  Continue with current plan of care      Recommendations for follow up therapy are one component of a multi-disciplinary discharge planning process, led by the attending physician.  Recommendations may be updated based on patient status, additional functional criteria and insurance authorization.    Recommendations  Diet recommendations: Dysphagia 1 (puree);Nectar-thick liquid Liquids provided via: Teaspoon Medication Administration:  (crushed with icecream) Supervision: Full supervision/cueing for compensatory strategies Compensations: Slow rate;Small sips/bites;Multiple dry swallows after each bite/sip Postural Changes and/or Swallow Maneuvers: Seated upright 90 degrees                  Oral care QID;Staff/trained caregiver to provide oral care     Dysphagia,  unspecified (R13.10)     Continue with current plan of care    Jose Gilbert H. Romie Levee, CCC-SLP Speech Language Pathologist    Georgetta Haber  12/24/2022, 12:53 PM

## 2022-12-25 ENCOUNTER — Inpatient Hospital Stay (HOSPITAL_COMMUNITY): Payer: Medicare Other

## 2022-12-25 ENCOUNTER — Other Ambulatory Visit (HOSPITAL_COMMUNITY): Payer: Self-pay | Admitting: *Deleted

## 2022-12-25 DIAGNOSIS — G20A1 Parkinson's disease without dyskinesia, without mention of fluctuations: Secondary | ICD-10-CM | POA: Diagnosis not present

## 2022-12-25 DIAGNOSIS — I5031 Acute diastolic (congestive) heart failure: Secondary | ICD-10-CM | POA: Diagnosis not present

## 2022-12-25 DIAGNOSIS — R338 Other retention of urine: Secondary | ICD-10-CM | POA: Diagnosis not present

## 2022-12-25 DIAGNOSIS — E43 Unspecified severe protein-calorie malnutrition: Secondary | ICD-10-CM | POA: Diagnosis not present

## 2022-12-25 DIAGNOSIS — G9341 Metabolic encephalopathy: Secondary | ICD-10-CM | POA: Diagnosis not present

## 2022-12-25 LAB — BASIC METABOLIC PANEL
Anion gap: 7 (ref 5–15)
BUN: 51 mg/dL — ABNORMAL HIGH (ref 8–23)
CO2: 26 mmol/L (ref 22–32)
Calcium: 8.4 mg/dL — ABNORMAL LOW (ref 8.9–10.3)
Chloride: 111 mmol/L (ref 98–111)
Creatinine, Ser: 1.67 mg/dL — ABNORMAL HIGH (ref 0.61–1.24)
GFR, Estimated: 38 mL/min — ABNORMAL LOW (ref >60)
Glucose, Bld: 149 mg/dL — ABNORMAL HIGH (ref 70–99)
Potassium: 3.2 mmol/L — ABNORMAL LOW (ref 3.5–5.1)
Sodium: 144 mmol/L (ref 135–145)

## 2022-12-25 LAB — ECHOCARDIOGRAM COMPLETE
Area-P 1/2: 3.21 cm2
Height: 68 in
S' Lateral: 1.9 cm
Weight: 1932.99 oz

## 2022-12-25 MED ORDER — POTASSIUM CHLORIDE CRYS ER 20 MEQ PO TBCR
40.0000 meq | EXTENDED_RELEASE_TABLET | Freq: Once | ORAL | Status: AC
  Administered 2022-12-25: 40 meq via ORAL
  Filled 2022-12-25: qty 2

## 2022-12-25 MED ORDER — BISACODYL 10 MG RE SUPP
10.0000 mg | Freq: Once | RECTAL | Status: AC
  Administered 2022-12-25: 10 mg via RECTAL
  Filled 2022-12-25: qty 1

## 2022-12-25 NOTE — Progress Notes (Signed)
Noted patient had no had any urine output since last canister emptying. Bladder scanned patient, bladder scan noted 992 in bladder. MD Zierle-Ghosh made aware. New orders placed in and out cath patient. In and out completed, output 1250 yellow in color. MD Zierle-Ghosh made aware.

## 2022-12-25 NOTE — Progress Notes (Signed)
*  PRELIMINARY RESULTS* Echocardiogram 2D Echocardiogram has been performed.  Stacey Drain 12/25/2022, 11:52 AM

## 2022-12-25 NOTE — Progress Notes (Signed)
[facility] Progress Note  Patient: Jose Gilbert    WJX:[id number]  DOA: 12/22/2022    Date of Service: the patient was seen and examined on 12/25/2022  Brief hospital course: Patient is a 89 year old male with past medical history of Parkinson's disease, dementia, hypertension and depression who came from his skilled nursing facility to the ED on 5/5 after a fall and at that time, was diagnosed with a type II odontoid fracture with mild displacement/distraction fracture and patient was discharged with c-collar and outpatient neurology follow-up as well as following up with neurosurgery in 2 weeks.  Patient brought back to the emergency room on 5/8 with acute confusion, not eating or drinking and not ambulatory.  Initial lab work unremarkable, but patient remained obtunded at first.  However, patient has started improving.  More alert.  Seen by speech therapy and was able to remain alert enough that he could have medicines and ice cream or applesauce.  Have been able to start getting doses of Sinemet into a.m. and patient remains awake and interactive.   Assessment and Plan: Senile dementia with behavioral disturbance and delirium/acute metabolic encephalopathy: Patient slightly more alert this afternoon and passed swallow evaluation.  Procalcitonin negative.  Acute encephalopathy appears to have resolved.  I suspect that following his fall and cervical fracture, he may have developed dysphagia and difficulty/inability take his Sinemet.  This led to being more withdrawn and with decreased level of responsiveness.  Hypokalemia: Replacing as needed.  Dysphagia: In part chronic due to Parkinson's and acute due to cervical fracture.  Being followed by speech therapy and on dysphagia 1 diet with nectar thick liquids  Urinary retention: Noted on evening of 5/10.  In-N-Out cath led to 900 cc removed and patient has been since voiding.  Will continue to follow.  Parkinson's disease: I have restarted  Sinemet which has improved patient's mentation.  CAD and hyperlipidemia: Restart statin.  CKD 3A: Renal function at baseline, continue gentle IV fluids.  Creatinine today at 1.67.  Recent fall with cervical fracture: Repeat films in ED note stable fracture from several days ago.  Acute diastolic heart failure: Noted elevated BNP of 267, checked because pressures had been elevated.  No previous history of heart failure no echocardiogram on chart.  Discussed with daughter we will go ahead and check echo just to assess given that he is continuing to get fluids.  Given mild BNP and CKD, will not give Lasix especially as given poor malnutrition state, he will likely third space.   Elevated blood pressures: No history of hypertension.  As needed hydralazine.  Body mass index is 18.37 kg/m.        Consultants: None  Procedures: None  Antimicrobials: None  Code Status: DNR   Subjective: Patient continues to remain interactive.  Denies any complaints.  Objective: Blood pressure mildly elevated at times Vitals:   12/24/22 1326 12/25/22 0401  BP: (!) 121/51 (!) 165/89  Pulse: 81 83  Resp: 14 18  Temp: 98 F (36.7 C) 98.6 F (37 C)  SpO2: 94% 99%    Intake/Output Summary (Last 24 hours) at 12/25/2022 0856 Last data filed at 12/25/2022 0029 Gross per 24 hour  Intake 220 ml  Output 1400 ml  Net -1180 ml    Filed Weights   12/22/22 1447 12/23/22 0401  Weight: 63.5 kg 54.8 kg   Body mass index is 18.37 kg/m.  Exam:  General: Oriented x 1-2, interactive HEENT: Normocephalic, in c-collar, mucous membranes are dry Cardiovascular:  Regular rate and rhythm, S1-S2 Respiratory: Clear to auscultation bilaterally although inspiratory effort limited Abdomen: Soft, nondistended, hypoactive bowel sounds Musculoskeletal: No clubbing or cyanosis, trace pitting edema Psychiatry: Appropriate Neurology: Ongoing Parkinson's, contracted  Data Reviewed: Potassium of 3.2.  Creatinine  of 1.67.  Disposition:  Status is: Inpatient    Anticipated discharge date: 5/13, back to skilled nursing  Remaining issues to be resolved so that patient can be discharged:  -Speech therapy follow-up -Return back to skilled nursing 5/13 -Echocardiogram   Family Communication: Will call daughter DVT Prophylaxis: heparin injection 5,000 Units Start: 12/23/22 0600    Author: Hollice Espy ,MD 12/25/2022 8:56 AM  To reach On-call, see care teams to locate the attending and reach out via [email or web address]. Between 7PM-7AM, please contact night-coverage If you still have difficulty reaching the attending provider, please page the [facility] (Director on Call) for [facility] on amion for assistance.

## 2022-12-25 NOTE — Progress Notes (Signed)
Abdomen distended bladder scanned >1000 in bladder MD informed. No urine output this shift. Awaiting orders.

## 2022-12-26 DIAGNOSIS — R4182 Altered mental status, unspecified: Secondary | ICD-10-CM | POA: Diagnosis not present

## 2022-12-26 DIAGNOSIS — G20A1 Parkinson's disease without dyskinesia, without mention of fluctuations: Secondary | ICD-10-CM | POA: Diagnosis not present

## 2022-12-26 DIAGNOSIS — E785 Hyperlipidemia, unspecified: Secondary | ICD-10-CM | POA: Diagnosis not present

## 2022-12-26 DIAGNOSIS — G20A2 Parkinson's disease without dyskinesia, with fluctuations: Secondary | ICD-10-CM | POA: Diagnosis not present

## 2022-12-26 DIAGNOSIS — E860 Dehydration: Secondary | ICD-10-CM

## 2022-12-26 LAB — BASIC METABOLIC PANEL
Anion gap: 7 (ref 5–15)
BUN: 37 mg/dL — ABNORMAL HIGH (ref 8–23)
CO2: 25 mmol/L (ref 22–32)
Calcium: 8.2 mg/dL — ABNORMAL LOW (ref 8.9–10.3)
Chloride: 115 mmol/L — ABNORMAL HIGH (ref 98–111)
Creatinine, Ser: 1.09 mg/dL (ref 0.61–1.24)
GFR, Estimated: >60 mL/min (ref >60)
Glucose, Bld: 119 mg/dL — ABNORMAL HIGH (ref 70–99)
Potassium: 3.1 mmol/L — ABNORMAL LOW (ref 3.5–5.1)
Sodium: 147 mmol/L — ABNORMAL HIGH (ref 135–145)

## 2022-12-26 MED ORDER — POTASSIUM CHLORIDE 2 MEQ/ML IV SOLN: INTRAVENOUS | Status: DC

## 2022-12-26 MED ORDER — TAMSULOSIN HCL 0.4 MG PO CAPS
0.4000 mg | ORAL_CAPSULE | Freq: Every day | ORAL | Status: DC
  Administered 2022-12-26: 0.4 mg via ORAL
  Filled 2022-12-26: qty 1

## 2022-12-26 MED ORDER — CHLORHEXIDINE GLUCONATE CLOTH 2 % EX PADS
6.0000 | MEDICATED_PAD | Freq: Every day | CUTANEOUS | Status: DC
  Administered 2022-12-27: 6 via TOPICAL

## 2022-12-26 MED ORDER — KCL IN DEXTROSE-NACL 40-5-0.45 MEQ/L-%-% IV SOLN: INTRAVENOUS | Status: AC

## 2022-12-26 NOTE — Progress Notes (Signed)
[facility] Progress Note  Patient: ELISEY HALBERG    BJY:[id number]  DOA: 12/22/2022    Date of Service: the patient was seen and examined on 12/26/2022  Brief hospital course: Patient is a 89 year old male with past medical history of Parkinson's disease, dementia, hypertension and depression who came from his skilled nursing facility to the ED on 5/5 after a fall and at that time, was diagnosed with a type II odontoid fracture with mild displacement/distraction fracture and patient was discharged with c-collar and outpatient neurology follow-up as well as following up with neurosurgery in 2 weeks.  Patient brought back to the emergency room on 5/8 with acute confusion, not eating or drinking and not ambulatory.  Initial lab work unremarkable, but patient remained obtunded at first.  However, patient has started improving.  More alert.  Seen by speech therapy and was able to remain alert enough that he could have medicines and ice cream or applesauce.  Have been able to start getting doses of Sinemet into a.m. and patient remains awake and interactive.   Assessment and Plan: Senile dementia with behavioral disturbance and delirium/acute metabolic encephalopathy: Patient slightly more alert this afternoon and passed swallow evaluation.  Procalcitonin negative.  Acute encephalopathy appears to have resolved.  I suspect that following his fall and cervical fracture, he may have developed dysphagia and difficulty/inability take his Sinemet.  This led to being more withdrawn and with decreased level of responsiveness.  Hypokalemia:  -3.1 currently; continue repletion and follow trend.  Dysphagia: In part chronic due to Parkinson's and acute due to cervical fracture.  Being followed by speech therapy and on dysphagia 1 diet with nectar thick liquids  Urinary retention: Noted on evening of 5/10.  In-N-Out cath led to 900 cc removed and patient has been since unable to void spontaneously.   -Flomax  daily initiated -Planning to discharge patient with Foley catheter in place and outpatient follow-up with urology.    Parkinson's disease:  -Continue treatment with Sinemet -Continue supportive care -Dysphagia 1 diet with nectar thick liquids recommended by speech.  -Continue outpatient follow-up.  CAD and hyperlipidemia:  -No chest pain -Continue statin.  CKD 3A: Renal function at baseline, continue gentle IV fluids.  Creatinine today at 1.09  Recent fall with cervical fracture:  -Repeat films in ED note stable fracture from several days ago. -Continue the use of c-collar, physical therapy and outpatient follow-up with neurosurgery.  Elevated BNP: -Initially presumed presence of diastolic failure; 2D echo has rule out any abnormalities.  -No signs of fluid overload appreciated currently -Patient with good oxygen saturation on room air and denying  -Diuretic therapy will not be provided.  Elevated blood pressures: No history of hypertension.   -Continue as needed hydralazine. -Flomax daily has been initiated which will help with blood pressure control.  Severe protein calorie malnutrition: -Continue feeding supplement and supportive care. -Patient's nutritional status compromise from chronic illness and dysphagia. Body mass index is 18.37 kg/m.        Consultants: None  Procedures: None  Antimicrobials: None  Code Status: DNR   Subjective: Oriented x 1, following commands appropriately, tolerating dysphagia 1 diet and demonstrating no acute distress.  Currently afebrile for the most part hemodynamically stable.  Chronically ill and underweight on exam.  Objective: Vitals:   12/26/22 0409 12/26/22 1238  BP: (!) 176/77 (!) 126/56  Pulse: 73 76  Resp: 20 18  Temp: 98.7 F (37.1 C) 98.8 F (37.1 C)  SpO2:  98%  Intake/Output Summary (Last 24 hours) at 12/26/2022 1420 Last data filed at 12/26/2022 0533 Gross per 24 hour  Intake 15 ml  Output 650 ml  Net  -635 ml   Filed Weights   12/22/22 1447 12/23/22 0401  Weight: 63.5 kg 54.8 kg   Body mass index is 18.37 kg/m.  Exam: General exam: Alert, awake, oriented x 1; interactive and able to follow commands.  No chest pain, no nausea, no vomiting.  C-collar in place. Respiratory system: Good air movement bilaterally; positive scattered rhonchi.  No wheezing good saturation on room air. Cardiovascular system: Rate controlled, no rubs, no gallops, no JVD. Gastrointestinal system: Abdomen is nondistended, soft and nontender. No organomegaly or masses felt. Normal bowel sounds heard. Central nervous system: No new focal deficit.  Sudden rigidity and mild contractures from chronic Parkinson appreciated. Extremities: No cyanosis, clubbing or edema. Skin: No petechiae. Psychiatry: Stable mood.  Data Reviewed: Basic metabolic panel: Sodium 147, potassium 3.1, chloride 115, bicarb 25, BUN 37, creatinine 1.09 and GFR > 60  Disposition:  Status is: Inpatient  Anticipated discharge date: 5/13, back to skilled nursing.  Remaining issues to be resolved so that patient can be discharged:  -Speech therapy has seen patient and recommended dysphagia 1 diet with nectar thick liquids; father follow-up on future recommendations based on patient's clinical response at the facility. -Return back to skilled nursing 5/13; TOC has been made aware and if clinically stable will plan for discharge tomorrow. -Echocardiogram: Demonstrating normal ejection fraction, no wall motion abnormalities and no significant valvular disorder.  There is no presence of heart failure by echo. -Outpatient follow-up with urology service if patient failed voiding trial at the nursing home in the next 5 days or so.   Family Communication: Daughter updated at bedside.  DVT Prophylaxis: heparin injection 5,000 Units Start: 12/23/22 0600    Author: Vassie Loll ,MD 12/26/2022 2:20 PM  To reach On-call, see care teams to locate  the attending and reach out via [email or web address]. Between 7PM-7AM, please contact night-coverage If you still have difficulty reaching the attending provider, please page the [facility] (Director on Call) for [facility] on amion for assistance.

## 2022-12-27 DIAGNOSIS — R4182 Altered mental status, unspecified: Secondary | ICD-10-CM | POA: Diagnosis not present

## 2022-12-27 DIAGNOSIS — E43 Unspecified severe protein-calorie malnutrition: Secondary | ICD-10-CM | POA: Diagnosis not present

## 2022-12-27 DIAGNOSIS — E876 Hypokalemia: Secondary | ICD-10-CM

## 2022-12-27 DIAGNOSIS — I10 Essential (primary) hypertension: Secondary | ICD-10-CM | POA: Diagnosis not present

## 2022-12-27 DIAGNOSIS — I2510 Atherosclerotic heart disease of native coronary artery without angina pectoris: Secondary | ICD-10-CM

## 2022-12-27 DIAGNOSIS — G20A1 Parkinson's disease without dyskinesia, without mention of fluctuations: Secondary | ICD-10-CM | POA: Diagnosis not present

## 2022-12-27 DIAGNOSIS — R338 Other retention of urine: Secondary | ICD-10-CM

## 2022-12-27 DIAGNOSIS — S12001A Unspecified nondisplaced fracture of first cervical vertebra, initial encounter for closed fracture: Secondary | ICD-10-CM

## 2022-12-27 LAB — BASIC METABOLIC PANEL
Anion gap: 5 (ref 5–15)
BUN: 36 mg/dL — ABNORMAL HIGH (ref 8–23)
CO2: 24 mmol/L (ref 22–32)
Calcium: 8 mg/dL — ABNORMAL LOW (ref 8.9–10.3)
Chloride: 118 mmol/L — ABNORMAL HIGH (ref 98–111)
Creatinine, Ser: 1.3 mg/dL — ABNORMAL HIGH (ref 0.61–1.24)
GFR, Estimated: 52 mL/min — ABNORMAL LOW (ref >60)
Glucose, Bld: 153 mg/dL — ABNORMAL HIGH (ref 70–99)
Potassium: 3.6 mmol/L (ref 3.5–5.1)
Sodium: 147 mmol/L — ABNORMAL HIGH (ref 135–145)

## 2022-12-27 MED ORDER — ORAL CARE MOUTH RINSE: 15.0000 mL | OROMUCOSAL | Status: DC | PRN

## 2022-12-27 MED ORDER — SIMVASTATIN 40 MG PO TABS: 40.0000 mg | ORAL_TABLET | Freq: Every day | ORAL | Status: AC

## 2022-12-27 MED ORDER — ORAL CARE MOUTH RINSE
15.0000 mL | OROMUCOSAL | Status: DC
  Administered 2022-12-27: 15 mL via OROMUCOSAL

## 2022-12-27 MED ORDER — ENSURE COMPLETE PO LIQD: 237.0000 mL | Freq: Two times a day (BID) | ORAL | Status: AC

## 2022-12-27 MED ORDER — TAMSULOSIN HCL 0.4 MG PO CAPS: 0.4000 mg | ORAL_CAPSULE | Freq: Every day | ORAL | 1 refills | Status: AC

## 2022-12-27 NOTE — Care Management Important Message (Signed)
Important Message  Patient Details  Name: Jose Gilbert MRN: [id number] Date of Birth: 03/09/1928   Medicare Important Message Given:  Yes     Corey Harold 12/27/2022, 11:41 AM

## 2022-12-27 NOTE — Discharge Summary (Signed)
Physician Discharge Summary   Patient: Jose Gilbert MRN: [id number] DOB: 10/26/1927  Admit date:     12/22/2022  Discharge date: 12/27/22  Discharge Physician: Vassie Loll   PCP: Lindaann Pascal   Recommendations at discharge:  Repeat basic metabolic panel to follow electrolytes and renal function Reassess blood pressure and adjust antihypertensive treatment if required DC Foley catheter and obtain voiding trial; if patient failed outpatient follow-up with Dr. Ronne Binning (urology service) recommended. Follow dysphagia 1 diet with nectar thick liquids.  Discharge Diagnoses: Principal Problem:   AMS (altered mental status) Active Problems:   Parkinson's disease   Acute encephalopathy   Hypertension   CAD (coronary artery disease)   Protein-calorie malnutrition, severe   Dementia due to Parkinson's disease without behavioral disturbance (HCC)   HLD (hyperlipidemia)   Acute metabolic encephalopathy   Acute urinary retention   Hypokalemia  Resolved Problems:   * No resolved hospital problems. *  Brief Hospital admission narrative: Patient is a 89 year old male with past medical history of Parkinson's disease, dementia, hypertension and depression who came from his skilled nursing facility to the ED on 5/5 after a fall and at that time, was diagnosed with a type II odontoid fracture with mild displacement/distraction fracture and patient was discharged with c-collar and outpatient neurology follow-up as well as following up with neurosurgery in 2 weeks.  Patient brought back to the emergency room on 5/8 with acute confusion, not eating or drinking and not ambulatory.    Assessment and Plan: Senile dementia with behavioral disturbance and delirium/acute metabolic encephalopathy: Patient slightly more alert this afternoon and passed swallow evaluation.  Procalcitonin negative.  Acute encephalopathy appears to have resolved.  I suspect that following his fall and cervical fracture, he may  have developed dysphagia and difficulty/inability take his Sinemet.  This led to being more withdrawn and with decreased level of responsiveness.   Hypokalemia:  -3.6 after repletion -Continue to follow electrolytes trend/stability and further replete as needed.   Dysphagia: In part chronic due to Parkinson's and acute due to cervical fracture.  Being followed by speech therapy and on dysphagia 1 diet with nectar thick liquids   Urinary retention: Noted on evening of 5/10.  In-N-Out cath led to 900 cc removed and patient has been since unable to void spontaneously.   -Flomax daily initiated. -Planning to discharge patient with Foley catheter in place for voiding trials, if he fails will require outpatient follow-up with urology.     Parkinson's disease:  -Continue treatment with Sinemet -Continue supportive care -Dysphagia 1 diet with nectar thick liquids recommended by speech.  -Continue outpatient follow-up.   CAD and hyperlipidemia:  -No chest pain -Continue statin.   CKD 3A: Renal function at baseline, continue gentle IV fluids.  Creatinine today at 1.09   Recent fall with cervical fracture:  -Repeat films in ED note stable fracture from several days ago. -Continue the use of c-collar, physical therapy and outpatient follow-up with neurosurgery.   Elevated BNP: -Initially presumed presence of diastolic failure; 2D echo has rule out any abnormalities.  -No signs of fluid overload appreciated currently -Patient with good oxygen saturation on room air and denying  -Diuretic therapy will not be provided.   Elevated blood pressures: No history of hypertension.   -Continue as needed hydralazine. -Flomax daily has been initiated which will help with blood pressure control.   Severe protein calorie malnutrition: -Continue feeding supplement and supportive care. -Patient's nutritional status compromise from chronic illness and dysphagia. Body mass index  is 18.37 kg/m.    Consultants: Palliative care service Procedures performed: See below for x-ray reports. Disposition: Skilled nursing facility Diet recommendation: Dysphagia diet with nectar thick liquids.  DISCHARGE MEDICATION: Allergies as of 12/27/2022       Reactions   Benadryl [diphenhydramine] Other (See Comments)   UNK reaction ( not on MAR)   Imdur [isosorbide Nitrate] Other (See Comments)   UNK reaction   Iodinated Contrast Media Other (See Comments)   UNK reaction   Mirtazapine Other (See Comments)   UNK reaction   Penicillins Other (See Comments)   UNK reaction   Shellfish Allergy Other (See Comments)   Unk reaction   Tramadol Other (See Comments)   UNK reaction   Trazodone Rash        Medication List     STOP taking these medications    Belsomra 5 MG Tabs Generic drug: Suvorexant       TAKE these medications    acetaminophen 325 MG tablet Commonly known as: TYLENOL Take 650 mg by mouth every 8 (eight) hours as needed for mild pain.   carbidopa-levodopa 25-100 MG tablet Commonly known as: SINEMET IR Take 2 tablets by mouth in the morning.   feeding supplement (ENSURE COMPLETE) Liqd Take 237 mLs by mouth 2 (two) times daily between meals.   nitroGLYCERIN 0.4 MG SL tablet Commonly known as: NITROSTAT Place 0.4 mg under the tongue See admin instructions.   polyethylene glycol 17 g packet Commonly known as: MIRALAX / GLYCOLAX Take 17 g by mouth every 6 (six) hours as needed for mild constipation.   senna-docusate 8.6-50 MG tablet Commonly known as: Senokot-S Take 1 tablet by mouth at bedtime.   simvastatin 40 MG tablet Commonly known as: ZOCOR Take 1 tablet (40 mg total) by mouth at bedtime. What changed: medication strength   tamsulosin 0.4 MG Caps capsule Commonly known as: FLOMAX Take 1 capsule (0.4 mg total) by mouth daily after supper.   venlafaxine XR 75 MG 24 hr capsule Commonly known as: EFFEXOR-XR Take 225 mg by mouth at bedtime.         Follow-up Information     McKenzie, Mardene Celeste, MD. Go on 01/03/2023.   Specialty: Urology Why: if patient failed voiding trials; if foley catheter succesfully removed, he doesnt need to follo up with urology service. Contact information: [address] [place] [address] [phone]         [place] .   Specialty: Skilled Nursing Facility Contact information: [address] [place] [address] [phone]                Discharge Exam: Ceasar Mons Weights   12/22/22 1447 12/23/22 0401  Weight: 63.5 kg 54.8 kg   General exam: Alert, awake, oriented x 1; interactive and able to follow commands.  No chest pain, no nausea, no vomiting.  C-collar in place. Respiratory system: Good air movement bilaterally; positive scattered rhonchi.  No wheezing good saturation on room air. Cardiovascular system: Rate controlled, no rubs, no gallops, no JVD. Gastrointestinal system: Abdomen is nondistended, soft and nontender. No organomegaly or masses felt. Normal bowel sounds heard. Central nervous system: No new focal deficit.  Sudden rigidity and mild contractures from chronic Parkinson appreciated. Extremities: No cyanosis, clubbing or edema. Skin: No petechiae. Psychiatry: Stable mood.  Condition at discharge: Stable and improved.  The results of significant diagnostics from this hospitalization (including imaging, microbiology, ancillary and laboratory) are listed below for reference.   Imaging  Studies: ECHOCARDIOGRAM COMPLETE  Result Date: 12/25/2022    ECHOCARDIOGRAM REPORT   Patient Name:   Jose Gilbert Date of Exam: 12/25/2022 Medical Rec #:  [id number]      Height:       68.0 in Accession #:    [id number]     Weight:       120.8 lb Date of Birth:  [birth year]       BSA:          1.650 m Patient Age:    89 years       BP:           165/89 mmHg Patient Gender: M              HR:           83 bpm. [facility] Procedure: 2D Echo, Cardiac  Doppler and Color Doppler Indications:    CHF-Acute Diastolic I50.31  History:        Patient has no prior history of Echocardiogram examinations.                 CAD, Prior CABG; Risk Factors:Hypertension and Dyslipidemia. AMS                 (altered mental status), CKD (chronic kidney disease) (From Hx).  Sonographer:    Celesta Gentile RCS Referring Phys: [id number] SENDIL K [facility] IMPRESSIONS  1. Left ventricular ejection fraction, by estimation, is 65 to 70%. The left ventricle has normal function. The left ventricle has no regional wall motion abnormalities. There is mild left ventricular hypertrophy. Left ventricular diastolic parameters were normal.  2. Right ventricular systolic function is normal. The right ventricular size is normal.  3. The mitral valve is normal in structure. Trivial mitral valve regurgitation.  4. The aortic valve is tricuspid. Aortic valve regurgitation is trivial. Aortic valve sclerosis is present, with no evidence of aortic valve stenosis.  5. The inferior vena cava is dilated in size with <50% respiratory variability, suggesting right atrial pressure of 15 mmHg. FINDINGS  Left Ventricle: Left ventricular ejection fraction, by estimation, is 65 to 70%. The left ventricle has normal function. The left ventricle has no regional wall motion abnormalities. The left ventricular internal cavity size was normal in size. There is  mild left ventricular hypertrophy. Left ventricular diastolic parameters were normal. Right Ventricle: The right ventricular size is normal. Right vetricular wall thickness was not assessed. Right ventricular systolic function is normal. Left Atrium: Left atrial size was normal in size. Right Atrium: Right atrial size was normal in size. Pericardium: There is no evidence of pericardial effusion. Mitral Valve: The mitral valve is normal in structure. Trivial mitral valve regurgitation. Tricuspid Valve: The tricuspid valve is normal in structure. Tricuspid valve  regurgitation is trivial. Aortic Valve: The aortic valve is tricuspid. Aortic valve regurgitation is trivial. Aortic valve sclerosis is present, with no evidence of aortic valve stenosis. Pulmonic Valve: The pulmonic valve was normal in structure. Pulmonic valve regurgitation is not visualized. Aorta: The aortic root is normal in size and structure. Venous: The inferior vena cava is dilated in size with less than 50% respiratory variability, suggesting right atrial pressure of 15 mmHg. IAS/Shunts: No atrial level shunt detected by color flow Doppler.  LEFT VENTRICLE PLAX 2D LVIDd:         3.60 cm   Diastology LVIDs:         1.90 cm   LV e' medial:  5.66 cm/s LV PW:         0.90 cm   LV E/e' medial:  13.5 LV IVS:        1.20 cm   LV e' lateral:   7.29 cm/s LVOT diam:     1.70 cm   LV E/e' lateral: 10.5 LV SV:         58 LV SV Index:   35 LVOT Area:     2.27 cm  RIGHT VENTRICLE             IVC RV S prime:     12.00 cm/s  IVC diam: 2.20 cm TAPSE (M-mode): 1.8 cm LEFT ATRIUM             Index        RIGHT ATRIUM           Index LA diam:        3.10 cm 1.88 cm/m   RA Area:     12.90 cm LA Vol (A2C):   40.6 ml 24.61 ml/m  RA Volume:   28.70 ml  17.40 ml/m LA Vol (A4C):   52.1 ml 31.58 ml/m LA Biplane Vol: 46.4 ml 28.13 ml/m  AORTIC VALVE LVOT Vmax:   130.50 cm/s LVOT Vmean:  78.400 cm/s LVOT VTI:    0.254 m  AORTA Ao Root diam: 3.70 cm MITRAL VALVE                TRICUSPID VALVE MV Area (PHT): 3.21 cm     TR Peak grad:   16.6 mmHg MV Decel Time: 236 msec     TR Vmax:        204.00 cm/s MV E velocity: 76.30 cm/s MV A velocity: 108.00 cm/s  SHUNTS MV E/A ratio:  0.71         Systemic VTI:  0.25 m                             Systemic Diam: 1.70 cm Dietrich Pates MD Electronically signed by Dietrich Pates MD Signature Date/Time: 12/25/2022/4:06:10 PM    Final    DG Chest Portable 1 View  Result Date: 12/22/2022 CLINICAL DATA:  Fall several days ago with altered mental status EXAM: PORTABLE CHEST 1 VIEW COMPARISON:  Chest  radiograph dated 12/19/2022 FINDINGS: Normal lung volumes. No focal consolidations. No pleural effusion or pneumothorax. Similar cardiomediastinal silhouette. Median sternotomy wires are nondisplaced. IMPRESSION: No active disease. Electronically Signed   By: Agustin Cree M.D.   On: 12/22/2022 16:32   CT Cervical Spine Wo Contrast  Result Date: 12/22/2022 CLINICAL DATA:  Polytrauma, blunt Recent fractures of C1 and C2 EXAM: CT CERVICAL SPINE WITHOUT CONTRAST TECHNIQUE: Multidetector CT imaging of the cervical spine was performed without intravenous contrast. Multiplanar CT image reconstructions were also generated. RADIATION DOSE REDUCTION: This exam was performed according to the departmental dose-optimization program which includes automated exposure control, adjustment of the mA and/or kV according to patient size and/or use of iterative reconstruction technique. COMPARISON:  CT 3 days ago 12/19/2022 FINDINGS: Alignment: Stable from prior exam. Slight increasing displacement of the anterior type 2 dens fracture, similar to mm displacement posteriorly. Degenerative anterolisthesis of C4 on C5 and C7 on T1, stable. Exaggerated upper thoracic kyphosis and straightening of normal lordosis. No jumped or perched facets. Skull base and vertebrae: Again seen type 2 dens fracture. There is 2-3 mm posterior displacement of the tip of the dens with  respect to the body, unchanged. There is slight increased distraction anteriorly. Unchanged fractures through the right and left aspect of arch of C1. There are no new fractures. Soft tissues and spinal canal: Mild thickening at the level of the dens fracture but no significant canal hematoma. Disc levels: Stable multilevel degenerative disc disease and facet hypertrophy from recent prior. Upper chest: No acute or unexpected findings. Other: None. IMPRESSION: 1. Known type 2 dens fracture. Stable degree of posterior displacement, however there is slight increased distraction  anteriorly. 2. Unchanged fractures through the right and left aspect of the arch of C1. 3. No new fractures. Electronically Signed   By: Narda Rutherford M.D.   On: 12/22/2022 15:44   CT Head Wo Contrast  Result Date: 12/22/2022 CLINICAL DATA:  Mental status change, unknown cause EXAM: CT HEAD WITHOUT CONTRAST TECHNIQUE: Contiguous axial images were obtained from the base of the skull through the vertex without intravenous contrast. RADIATION DOSE REDUCTION: This exam was performed according to the departmental dose-optimization program which includes automated exposure control, adjustment of the mA and/or kV according to patient size and/or use of iterative reconstruction technique. COMPARISON:  Head CT 12/19/2022 FINDINGS: Brain: Stable degree of atrophy and chronic small vessel ischemia. No intracranial hemorrhage, mass effect, or midline shift. No hydrocephalus. The basilar cisterns are patent. No evidence of territorial infarct or acute ischemia. No extra-axial or intracranial fluid collection. Vascular: Atherosclerosis of skullbase vasculature without hyperdense vessel or abnormal calcification. Skull: No fracture or focal lesion. Sinuses/Orbits: No acute findings. Remote right nasal bone fracture. No mastoid effusion. Other: Diminishing frontal scalp hematoma. Right scalp skin staples in place. IMPRESSION: 1. No acute intracranial abnormality. 2. Stable atrophy and chronic small vessel ischemia. Electronically Signed   By: Narda Rutherford M.D.   On: 12/22/2022 15:38   CT CHEST ABDOMEN PELVIS WO CONTRAST  Result Date: 12/19/2022 CLINICAL DATA:  Trauma. EXAM: CT CHEST, ABDOMEN AND PELVIS WITHOUT CONTRAST TECHNIQUE: Multidetector CT imaging of the chest, abdomen and pelvis was performed following the standard protocol without IV contrast. RADIATION DOSE REDUCTION: This exam was performed according to the departmental dose-optimization program which includes automated exposure control, adjustment of the mA  and/or kV according to patient size and/or use of iterative reconstruction technique. COMPARISON:  Chest and pelvic radiograph dated 12/19/2022. CT abdomen pelvis dated 03/27/2016. FINDINGS: Evaluation of this exam is limited in the absence of intravenous contrast. CT CHEST FINDINGS Cardiovascular: There is no cardiomegaly or pericardial effusion. There is coronary vascular calcification and postsurgical changes of CABG. Moderate atherosclerotic calcification of the thoracic aorta. No aneurysmal dilatation. The central pulmonary arteries are grossly unremarkable. Mediastinum/Nodes: No hilar or mediastinal adenopathy. The esophagus is grossly unremarkable. No mediastinal fluid collection. Lungs/Pleura: Mild bibasilar atelectasis. No focal consolidation, pleural effusion, or pneumothorax. The central airways are patent. Musculoskeletal: Median sternotomy wires. No acute osseous pathology. CT ABDOMEN PELVIS FINDINGS No intra-abdominal free air or free fluid. Hepatobiliary: The liver is unremarkable. No biliary dilatation. The gallbladder is unremarkable. Pancreas: Unremarkable. No pancreatic ductal dilatation or surrounding inflammatory changes. Spleen: Normal in size without focal abnormality. Adrenals/Urinary Tract: The adrenal glands are unremarkable. Small right renal upper pole cyst. There is no hydronephrosis or nephrolithiasis on either side. The visualized ureters and urinary bladder appear unremarkable. Stomach/Bowel: There is sigmoid diverticulosis without active inflammatory changes. There is no bowel obstruction or active inflammation. The appendix is not visualized with certainty. No inflammatory changes identified in the right lower quadrant. Vascular/Lymphatic: Moderate aortoiliac atherosclerotic disease. The IVC  is unremarkable. No portal venous gas. There is no adenopathy. Reproductive: The prostate and seminal vesicles are grossly unremarkable. No pelvic mass. Other: None Musculoskeletal: Osteopenia  with degenerative changes. No acute osseous pathology. IMPRESSION: 1. No acute/traumatic intrathoracic, abdominal, or pelvic pathology. 2. Sigmoid diverticulosis. No bowel obstruction. 3.  Aortic Atherosclerosis (ICD10-I70.0). Electronically Signed   By: Elgie Collard M.D.   On: 12/19/2022 23:50   CT T-SPINE NO CHARGE  Result Date: 12/19/2022 CLINICAL DATA:  Fall. EXAM: CT THORACIC AND LUMBAR SPINE WITHOUT CONTRAST TECHNIQUE: Multidetector CT imaging of the thoracic and lumbar spine was performed without contrast. Multiplanar CT image reconstructions were also generated. RADIATION DOSE REDUCTION: This exam was performed according to the departmental dose-optimization program which includes automated exposure control, adjustment of the mA and/or kV according to patient size and/or use of iterative reconstruction technique. COMPARISON:  None Available. FINDINGS: Evaluation of this exam is limited due to osteopenia and respiratory motion artifact. CT THORACIC SPINE FINDINGS Alignment: No acute subluxation. Vertebrae: No acute fracture.  Osteopenia. Paraspinal and other soft tissues: Negative. Disc levels: No acute findings.  Degenerative changes. CT LUMBAR SPINE FINDINGS Segmentation: 5 lumbar type vertebrae. Alignment: No acute subluxation. Vertebrae: No acute fracture.  Osteopenia. Paraspinal and other soft tissues: Negative. Disc levels: No acute findings.  Degenerative changes. IMPRESSION: 1. No acute/traumatic thoracic or lumbar spine pathology. 2. Osteopenia with multilevel degenerative changes. Electronically Signed   By: Elgie Collard M.D.   On: 12/19/2022 23:43   CT L-SPINE NO CHARGE  Result Date: 12/19/2022 CLINICAL DATA:  Fall. EXAM: CT THORACIC AND LUMBAR SPINE WITHOUT CONTRAST TECHNIQUE: Multidetector CT imaging of the thoracic and lumbar spine was performed without contrast. Multiplanar CT image reconstructions were also generated. RADIATION DOSE REDUCTION: This exam was performed according to  the departmental dose-optimization program which includes automated exposure control, adjustment of the mA and/or kV according to patient size and/or use of iterative reconstruction technique. COMPARISON:  None Available. FINDINGS: Evaluation of this exam is limited due to osteopenia and respiratory motion artifact. CT THORACIC SPINE FINDINGS Alignment: No acute subluxation. Vertebrae: No acute fracture.  Osteopenia. Paraspinal and other soft tissues: Negative. Disc levels: No acute findings.  Degenerative changes. CT LUMBAR SPINE FINDINGS Segmentation: 5 lumbar type vertebrae. Alignment: No acute subluxation. Vertebrae: No acute fracture.  Osteopenia. Paraspinal and other soft tissues: Negative. Disc levels: No acute findings.  Degenerative changes. IMPRESSION: 1. No acute/traumatic thoracic or lumbar spine pathology. 2. Osteopenia with multilevel degenerative changes. Electronically Signed   By: Elgie Collard M.D.   On: 12/19/2022 23:43   DG Chest Portable 1 View  Result Date: 12/19/2022 CLINICAL DATA:  Fall EXAM: PORTABLE CHEST 1 VIEW COMPARISON:  11/01/2022 FINDINGS: Prior median sternotomy. Heart and mediastinal contours are within normal limits. No focal opacities or effusions. No acute bony abnormality. Tortuous aorta. Low lung volumes. IMPRESSION: No active cardiopulmonary disease. Electronically Signed   By: Charlett Nose M.D.   On: 12/19/2022 23:11   DG Pelvis Portable  Result Date: 12/19/2022 CLINICAL DATA:  Fall. EXAM: PORTABLE PELVIS 1-2 VIEWS COMPARISON:  Pelvic radiograph dated 09/09/2022. FINDINGS: No acute fracture or dislocation. The bones are osteopenic. Mild bilateral hip arthritic changes. Degenerative changes of the lower lumbar spine. The soft tissues are unremarkable. IMPRESSION: 1. No acute fracture or dislocation. 2. Mild bilateral hip arthritic changes. Electronically Signed   By: Elgie Collard M.D.   On: 12/19/2022 23:10   CT Cervical Spine Wo Contrast  Result Date:  12/19/2022 CLINICAL DATA:  Neck trauma (Age >= 65y).  Fall. EXAM: CT CERVICAL SPINE WITHOUT CONTRAST TECHNIQUE: Multidetector CT imaging of the cervical spine was performed without intravenous contrast. Multiplanar CT image reconstructions were also generated. RADIATION DOSE REDUCTION: This exam was performed according to the departmental dose-optimization program which includes automated exposure control, adjustment of the mA and/or kV according to patient size and/or use of iterative reconstruction technique. COMPARISON:  09/09/2022 FINDINGS: Alignment: Slight degenerative listhesis of C4 on C5 and C5 on C6, stable since prior study. Skull base and vertebrae: Type 2 odontoid fracture noted. Mild distraction. Fractures noted through the lateral aspect of the C1 ring bilaterally. Soft tissues and spinal canal: No prevertebral fluid or swelling. No visible canal hematoma. Disc levels: Diffuse advanced degenerative facet disease bilaterally. Moderate degenerative disc disease diffusely. Upper chest: No acute findings. Other: None IMPRESSION: Type 2 odontoid fracture with mild displacement/distraction. Fractures through the lateral aspects of the C1 ring bilaterally. Critical Value/emergent results were called by telephone at the time of interpretation on 12/19/2022 at 9:28 pm to provider HAYLEY NAASZ , who verbally acknowledged these results. Electronically Signed   By: Charlett Nose M.D.   On: 12/19/2022 21:28   CT Head Wo Contrast  Result Date: 12/19/2022 CLINICAL DATA:  Head trauma, minor (Age >= 65y).  Fall. EXAM: CT HEAD WITHOUT CONTRAST TECHNIQUE: Contiguous axial images were obtained from the base of the skull through the vertex without intravenous contrast. RADIATION DOSE REDUCTION: This exam was performed according to the departmental dose-optimization program which includes automated exposure control, adjustment of the mA and/or kV according to patient size and/or use of iterative reconstruction technique.  COMPARISON:  11/01/2022 FINDINGS: Brain: There is atrophy and chronic small vessel disease changes. No acute intracranial abnormality. Specifically, no hemorrhage, hydrocephalus, mass lesion, acute infarction, or significant intracranial injury. Vascular: No hyperdense vessel or unexpected calcification. Skull: No acute calvarial abnormality. Sinuses/Orbits: No acute findings Other: Soft tissue swelling in the forehead. IMPRESSION: Atrophy, chronic microvascular disease. No acute intracranial abnormality. Electronically Signed   By: Charlett Nose M.D.   On: 12/19/2022 21:18    Microbiology: Results for orders placed or performed during the hospital encounter of 12/30/21  Resp Panel by RT-PCR (Flu A&B, Covid) Nasopharyngeal Swab     Status: None   Collection Time: 12/30/21  8:57 PM   Specimen: Nasopharyngeal Swab; Nasopharyngeal(NP) swabs in vial transport medium  Result Value Ref Range Status   SARS Coronavirus 2 by RT PCR NEGATIVE NEGATIVE Final    Comment: (NOTE) SARS-CoV-2 target nucleic acids are NOT DETECTED.  The SARS-CoV-2 RNA is generally detectable in upper respiratory specimens during the acute phase of infection. The lowest concentration of SARS-CoV-2 viral copies this assay can detect is 138 copies/mL. A negative result does not preclude SARS-Cov-2 infection and should not be used as the sole basis for treatment or other patient management decisions. A negative result may occur with  improper specimen collection/handling, submission of specimen other than nasopharyngeal swab, presence of viral mutation(s) within the areas targeted by this assay, and inadequate number of viral copies(<138 copies/mL). A negative result must be combined with clinical observations, patient history, and epidemiological information. The expected result is Negative.  Fact Sheet for Patients:  BloggerCourse.com  Fact Sheet for Healthcare Providers:   SeriousBroker.it  This test is no t yet approved or cleared by the [place] FDA and  has been authorized for detection and/or diagnosis of SARS-CoV-2 by FDA under an Emergency Use Authorization (EUA). This EUA will remain  in effect (meaning this test can be used) for the duration of the COVID-19 declaration under Section 564(b)(1) of the Act, 21 U.S.C.section 360bbb-3(b)(1), unless the authorization is terminated  or revoked sooner.       Influenza A by PCR NEGATIVE NEGATIVE Final   Influenza B by PCR NEGATIVE NEGATIVE Final    Comment: (NOTE) The Xpert Xpress SARS-CoV-2/FLU/RSV plus assay is intended as an aid in the diagnosis of influenza from Nasopharyngeal swab specimens and should not be used as a sole basis for treatment. Nasal washings and aspirates are unacceptable for Xpert Xpress SARS-CoV-2/FLU/RSV testing.  Fact Sheet for Patients: BloggerCourse.com  Fact Sheet for Healthcare Providers: SeriousBroker.it  This test is not yet approved or cleared by the [place] FDA and has been authorized for detection and/or diagnosis of SARS-CoV-2 by FDA under an Emergency Use Authorization (EUA). This EUA will remain in effect (meaning this test can be used) for the duration of the COVID-19 declaration under Section 564(b)(1) of the Act, 21 U.S.C. section 360bbb-3(b)(1), unless the authorization is terminated or revoked.  Performed at [facility], [address], [place], [place] [address]     Labs: CBC: Recent Labs  Lab 12/22/22 1546 12/23/22 0458  WBC 11.0* 10.8*  NEUTROABS 8.5*  --   HGB 12.9* 11.9*  HCT 38.6* 36.4*  MCV 91.9 94.1  PLT 143* 134*   Basic Metabolic Panel: Recent Labs  Lab 12/22/22 1546 12/23/22 0458 12/25/22 0554 12/26/22 0518 12/27/22 0506  NA 138 140 144 147* 147*  K 4.4 3.9 3.2* 3.1* 3.6  CL 105 109 111 115* 118*  CO2 24 23 26 25  24   GLUCOSE 146* 128* 149* 119* 153*  BUN 37* 35* 51* 37* 36*  CREATININE 1.37* 1.07 1.67* 1.09 1.30*  CALCIUM 9.0 8.6* 8.4* 8.2* 8.0*  MG  --  2.4  --   --   --   PHOS  --  3.2  --   --   --    Liver Function Tests: Recent Labs  Lab 12/22/22 1546  AST 21  ALT 25  ALKPHOS 64  BILITOT 0.9  PROT 7.3  ALBUMIN 3.7   CBG: Recent Labs  Lab 12/22/22 1444 12/22/22 1508 12/23/22 1142  GLUCAP 145* 146* 141*    Discharge time spent: greater than 30 minutes.  Signed: Vassie Loll, MD [facility] 12/27/2022

## 2022-12-27 NOTE — Progress Notes (Signed)
Report given to [facility]

## 2022-12-27 NOTE — Progress Notes (Signed)
Palliative: Chart review completed.  No needs.  Jose Gilbert is to discharge back to [facility] where he has been under memory care for the last 3 years.  DNR/goldenrod form is on chart.  Conference with transition of care team related to patient condition, needs, goals of care, disposition.  Plan: Continue to treat the treatable but no CPR or intubation.  Return to [facility] under long-term care where he has been for 3 years. DNR/goldenrod form on chart.  No charge [name], NP Palliative medicine team Team phone [phone] Greater than 50% of this time was spent counseling and coordinating care related to the above assessment and plan.

## 2022-12-27 NOTE — TOC Transition Note (Signed)
Transition of Care [facility]) - CM/SW Discharge Note   Patient Details  Name: Jose Gilbert MRN: [id number] Date of Birth: 03-20-1928  Transition of Care [facility]) CM/SW Contact:  Karn Cassis, LCSW Phone Number: 12/27/2022, 11:25 AM   Clinical Narrative:  Pt d/c today back to [facility]. Per Herbert Seta at facility, pt is long term. She is aware authorization has been started and agreeable to send pt back while pending. Herbert Seta is also aware of diet changes and catheter. Discussed transportation with pt's daughter who plans to pick him up. RN given number to call report.      Final next level of care: Long Term Nursing Home Barriers to Discharge: Barriers Resolved   Patient Goals and CMS Choice CMS Medicare.gov Compare Post Acute Care list provided to:: Patient Represenative (must comment) Choice offered to / list presented to : Adult Children  Discharge Placement                  Patient to be transferred to facility by: daughter Name of family member notified: Rinaldo Cloud- daughter Patient and family notified of of transfer: 12/27/22  Discharge Plan and Services Additional resources added to the After Visit Summary for   In-house Referral: Clinical Social Work   Post Acute Care Choice: Resumption of Svcs/PTA Provider                               Social Determinants of Health (SDOH) Interventions     Readmission Risk Interventions     No data to display

## 2023-01-15 DEATH — deceased

## 2024-03-26 IMAGING — CT CT HEAD W/O CM
5 of 8 series · 15 of 47 positions shown, 16 images · non-contrast
Comparison: Recent head CT 12/30/2021.

CLINICAL DATA: Minor head trauma. Imaging performed x2 due to
initial motion.



[Series 2: head wo · axial · 0.41mm/px · z∈[+1208,+1268]mm · 3 of 24 slices shown, 4 images (1 of 2)]
[im 6/24  brain]
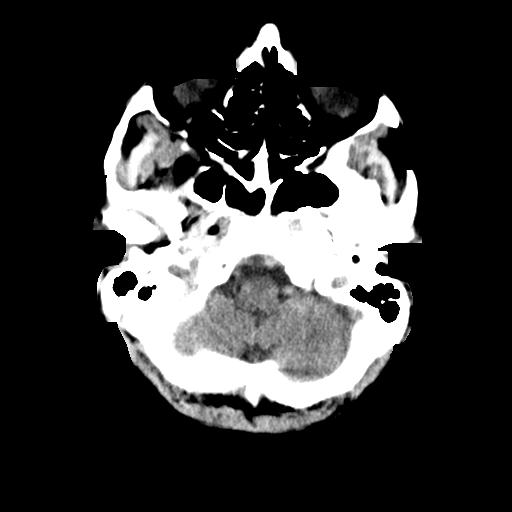
[im 6/24  bone]
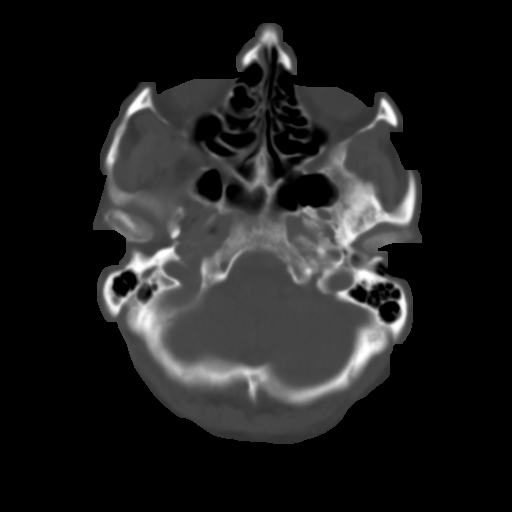
[im 12/24  brain]
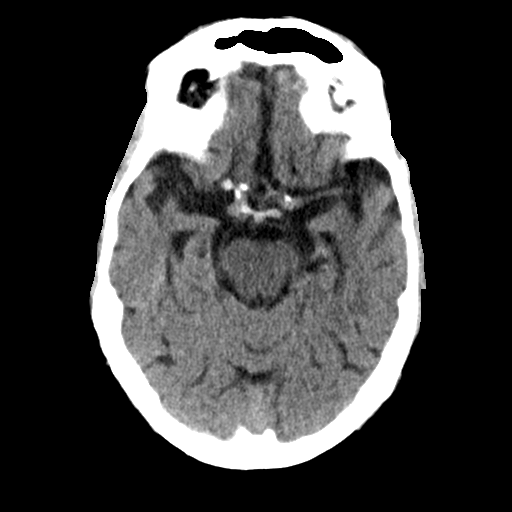
[im 18/24  brain]
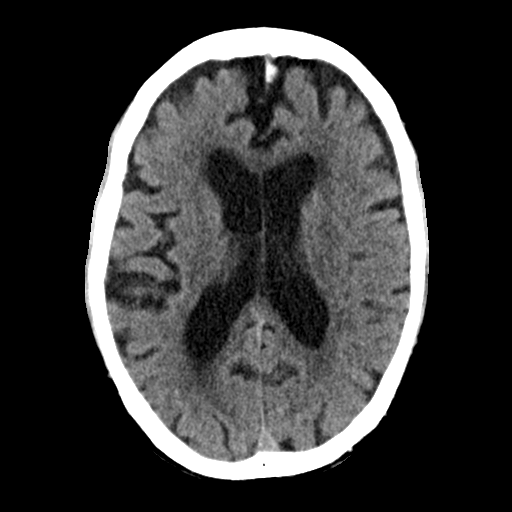

[Series 3: head bone · axial · 0.41mm/px · z∈[+1193,+1247]mm · 5 of 60 slices shown]
[im 6/60  bone]
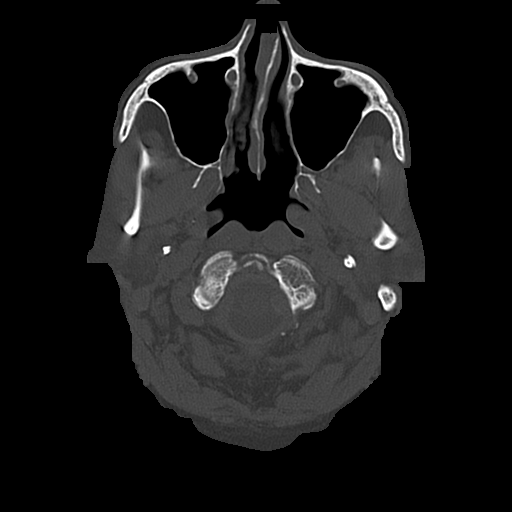
[im 11/60  bone]
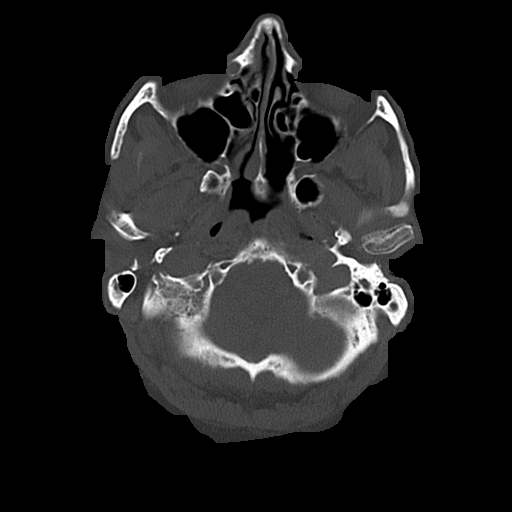
[im 22/60  bone]
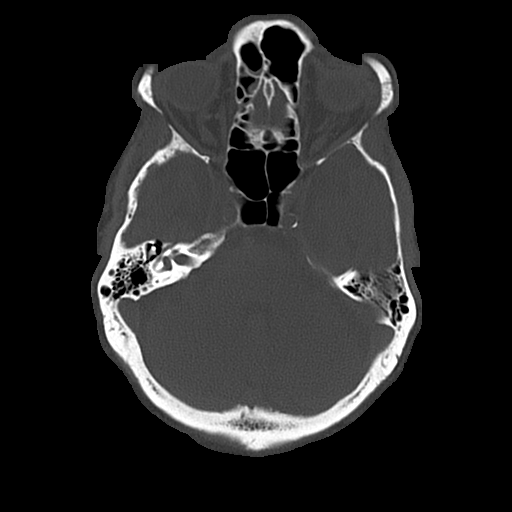
[im 27/60  bone]
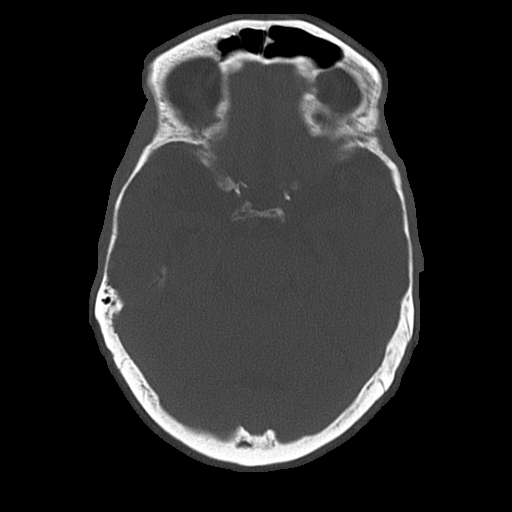
[im 33/60  bone]
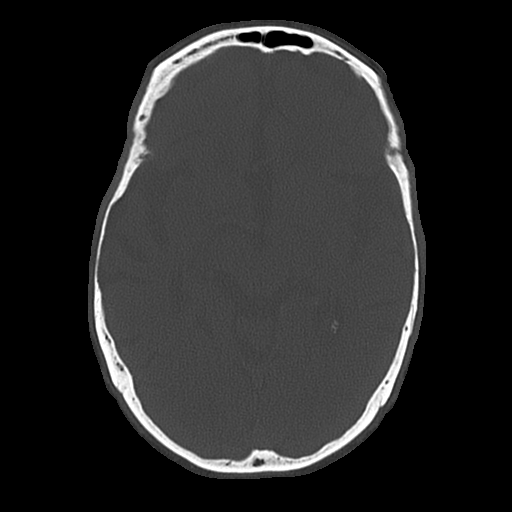

[Series 4: coronal soft · coronal · 0.23mm/px · 3 of 68 slices shown]
[im 17/68  brain]
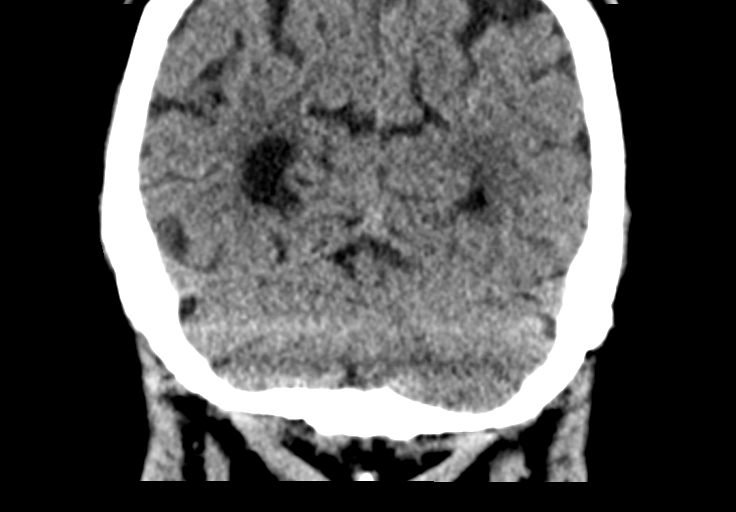
[im 34/68  brain]
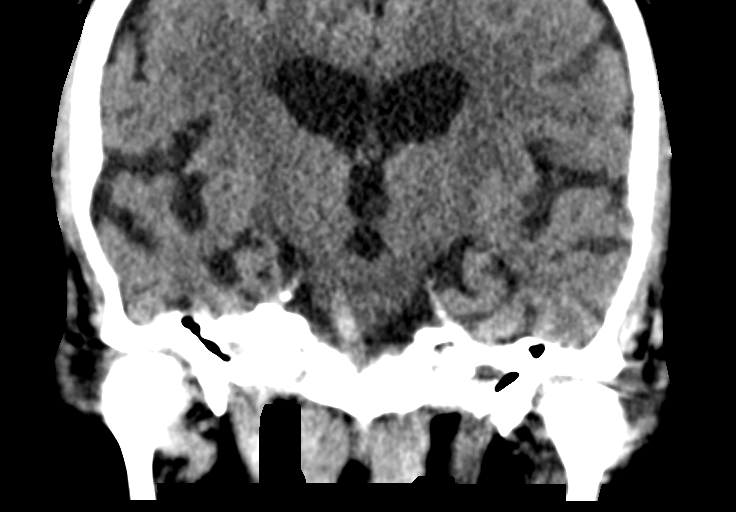
[im 51/68  brain]
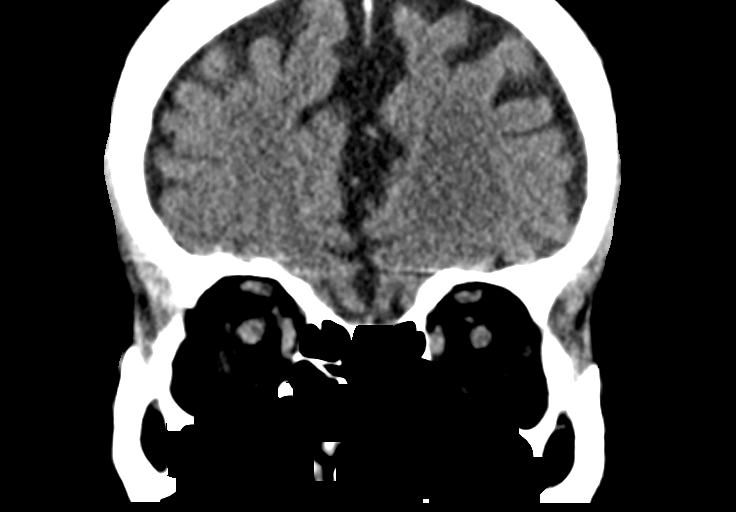

[Series 5: sagittal soft · sagittal · 0.24mm/px · 2 of 58 slices shown]
[im 20/58  brain]
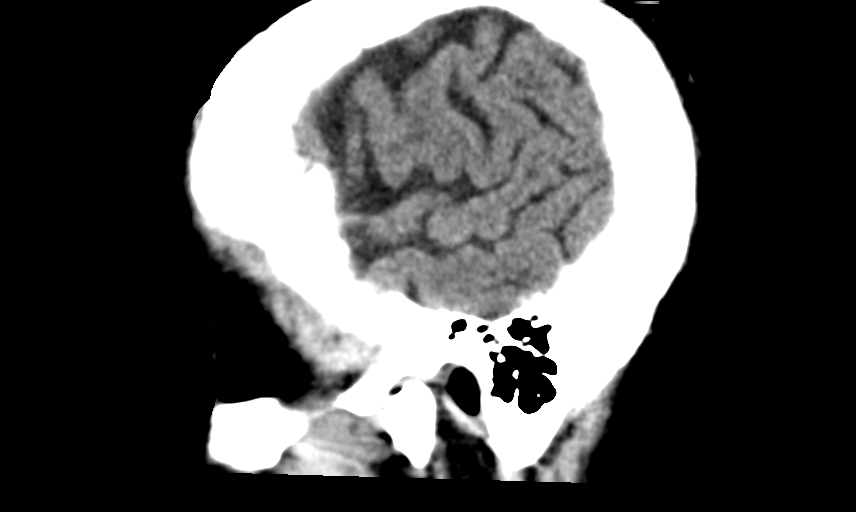
[im 39/58  brain]
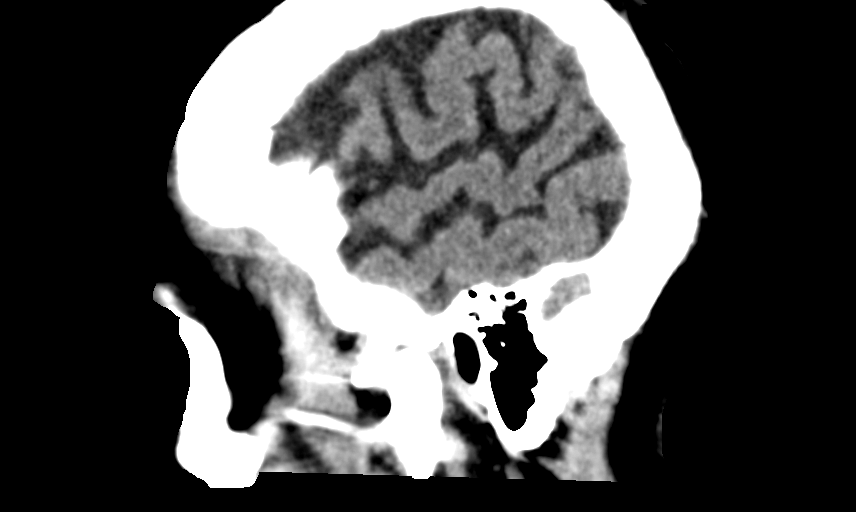

[Series 6: head wo · axial · 0.41mm/px · z∈[+1284,+1314]mm · 2 of 19 slices shown (2 of 2)]
[im 7/19  brain]
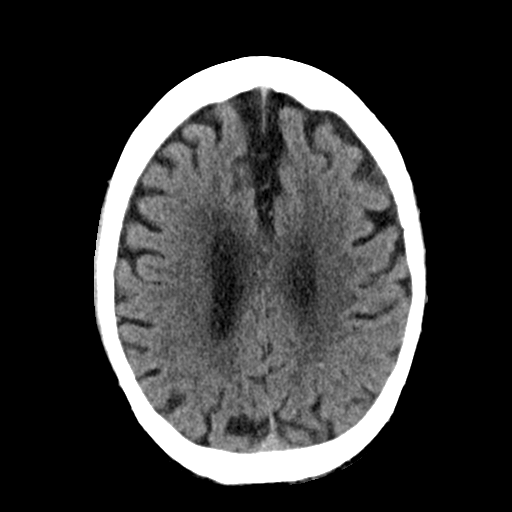
[im 13/19  brain]
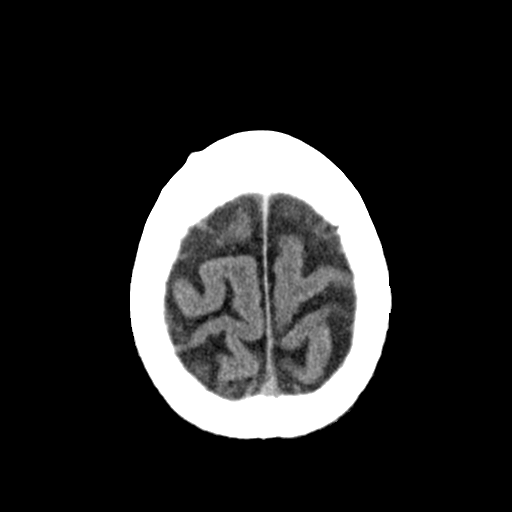

[15 of 47 positions shown; findings below may reference images not displayed]

FINDINGS: Brain: There is moderate cerebral atrophy, small-vessel disease and
atrophic ventriculomegaly with minimal to mild cerebellar atrophy.

Calcified 8 mm left frontal anterior parasagittal meningioma is
again noted. No new asymmetry is seen concerning for an acute
infarct, hemorrhage or mass.

There are senescent calcifications in the basal ganglia. The basal
cisterns are clear.

Tiny chronic lacunar infarcts in the external capsules are again
shown.

Vascular: Again noted calcifications in the distal vertebral
arteries and siphons. No hyperdense central vessel.

Skull: There are scattered cutaneous nodules in the bilateral
frontal scalp. No scalp hematoma is seen. The calvarium, skull base
and orbits are intact.

Sinuses/Orbits: No acute abnormality. Evidence of prior lens
replacements.

Other: There is no mastoid effusion.
IMPRESSION: No acute intracranial CT findings, depressed skull fractures or
interval changes. Chronic findings described above.

## 2024-03-26 IMAGING — DX DG LUMBAR SPINE COMPLETE 4+V
5 series · 5 of 5 positions shown · non-contrast
Comparison: None Available.

CLINICAL DATA: Fall

EXAM:
LUMBAR SPINE - COMPLETE 4+ VIEW

[l-spine ap]
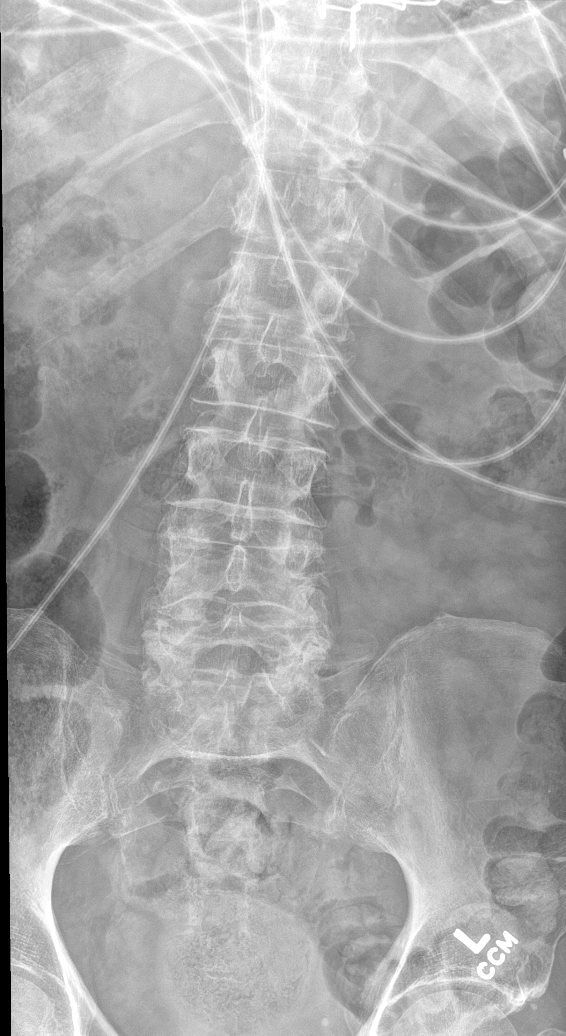

[l-spine obl (1 of 2)]
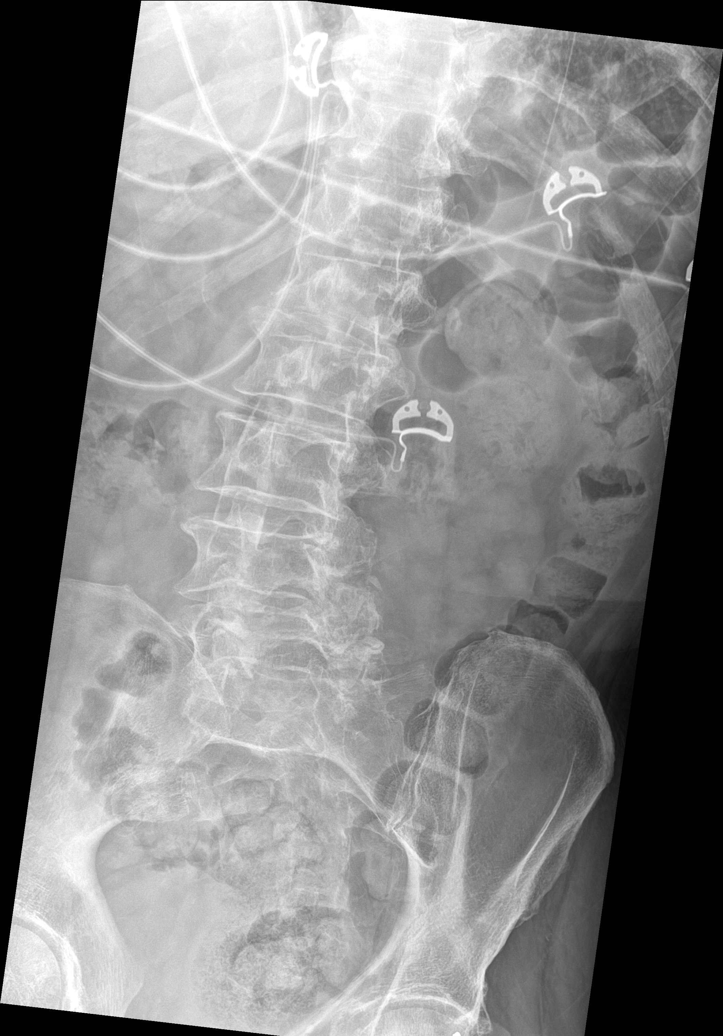

[l-spine obl (2 of 2)]
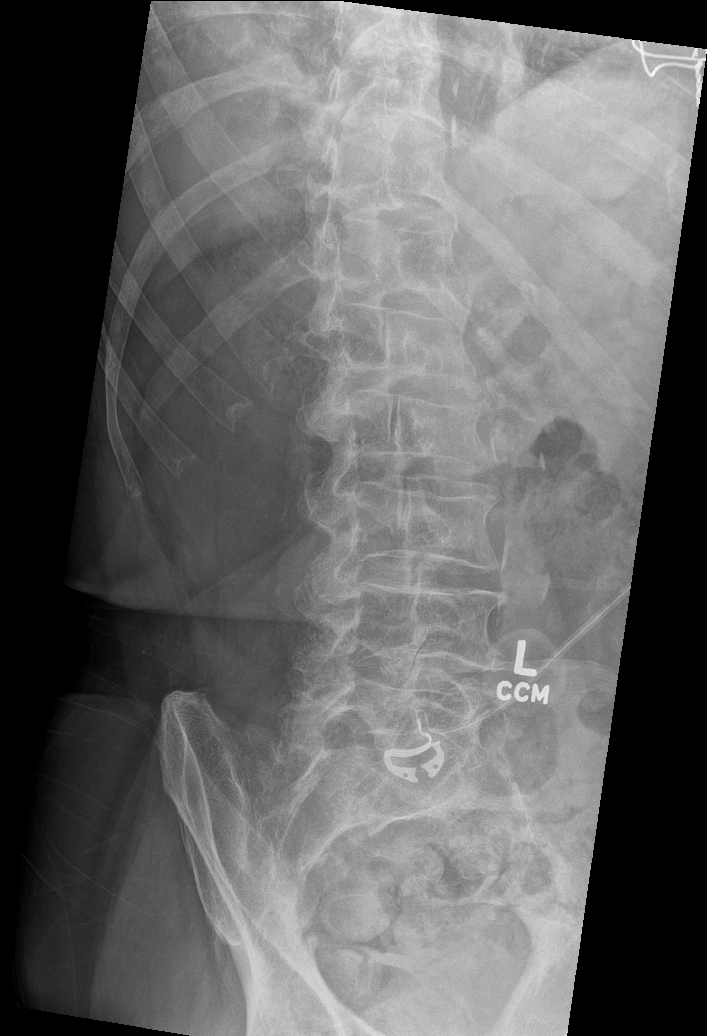

[l-spine lat]
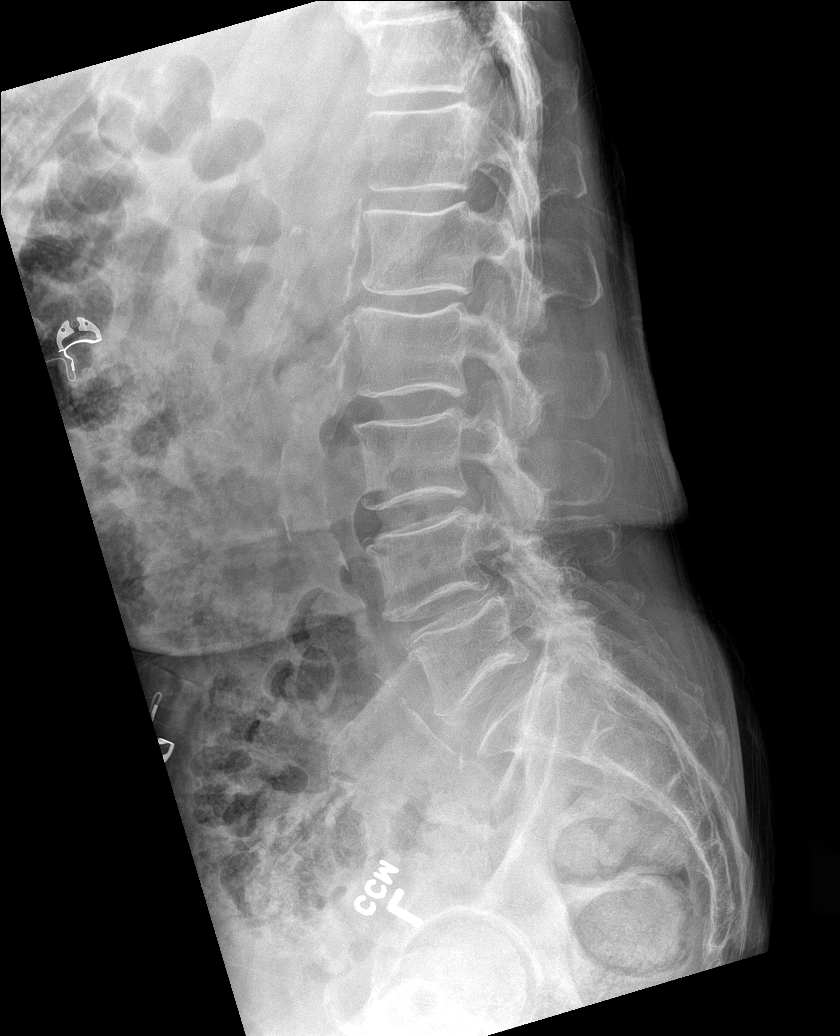

[l-spine spot]
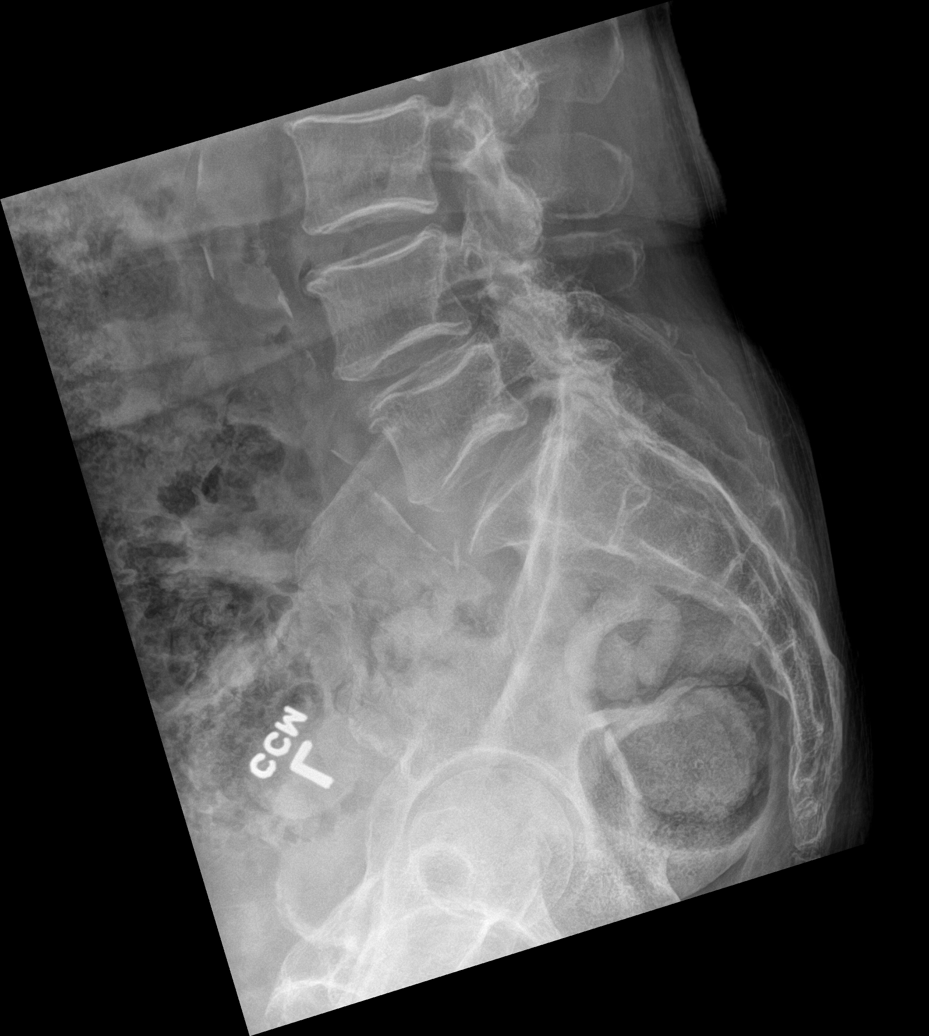

[5 of 5 positions shown; findings below may reference images not displayed]

FINDINGS: Five lumbar-type vertebral bodies.

Normal lumbar lordosis.

No evidence of fracture or dislocation. Vertebral body heights are
maintained.

Mild degenerative changes of the lower thoracic spine.

Visualized bony pelvis appears intact.

Vascular calcifications.
IMPRESSION: Negative.
